# Patient Record
Sex: Female | Born: 1975 | Race: Asian | Hispanic: No | Marital: Married | Smoking: Never smoker
Health system: Southern US, Community
[De-identification: ages and names within clinical notes are randomized; demographics above are authoritative.]

## PROBLEM LIST (undated history)

## (undated) DIAGNOSIS — D219 Benign neoplasm of connective and other soft tissue, unspecified: Secondary | ICD-10-CM

## (undated) HISTORY — DX: Benign neoplasm of connective and other soft tissue, unspecified: D21.9

## (undated) HISTORY — PX: MYOMECTOMY ABDOMINAL APPROACH: SUR870

---

## 2002-06-13 HISTORY — PX: MYOMECTOMY: SHX85

## 2012-11-23 ENCOUNTER — Ambulatory Visit: Payer: Self-pay | Admitting: Family Medicine

## 2015-10-26 DIAGNOSIS — J018 Other acute sinusitis: Secondary | ICD-10-CM | POA: Diagnosis not present

## 2015-10-26 DIAGNOSIS — E6609 Other obesity due to excess calories: Secondary | ICD-10-CM | POA: Diagnosis not present

## 2015-10-26 DIAGNOSIS — Z6834 Body mass index (BMI) 34.0-34.9, adult: Secondary | ICD-10-CM | POA: Diagnosis not present

## 2017-01-16 DIAGNOSIS — Z6833 Body mass index (BMI) 33.0-33.9, adult: Secondary | ICD-10-CM | POA: Diagnosis not present

## 2017-01-16 DIAGNOSIS — M79674 Pain in right toe(s): Secondary | ICD-10-CM | POA: Diagnosis not present

## 2017-01-16 DIAGNOSIS — S96911A Strain of unspecified muscle and tendon at ankle and foot level, right foot, initial encounter: Secondary | ICD-10-CM | POA: Diagnosis not present

## 2017-04-17 ENCOUNTER — Encounter: Payer: Self-pay | Admitting: Family Medicine

## 2017-04-17 ENCOUNTER — Ambulatory Visit: Payer: BLUE CROSS/BLUE SHIELD | Admitting: Family Medicine

## 2017-04-17 VITALS — BP 100/80 | HR 70 | Temp 98.8°F | Ht 62.5 in | Wt 196.0 lb

## 2017-04-17 DIAGNOSIS — R05 Cough: Secondary | ICD-10-CM

## 2017-04-17 DIAGNOSIS — J029 Acute pharyngitis, unspecified: Secondary | ICD-10-CM | POA: Diagnosis not present

## 2017-04-17 MED ORDER — GUAIFENESIN-DM 100-10 MG/5ML PO SYRP: 5.0000 mL | ORAL_SOLUTION | ORAL | 0 refills | Status: DC | PRN

## 2017-04-17 MED ORDER — METHYLPREDNISOLONE ACETATE 80 MG/ML IJ SUSP
80.0000 mg | Freq: Once | INTRAMUSCULAR | Status: AC
  Administered 2017-04-17: 80 mg via INTRAMUSCULAR

## 2017-04-17 NOTE — Progress Notes (Signed)
   Subjective:  Dorothy Flores is a 41 y.o. female who presents today with a chief complaint of sore throat and to establish care.   HPI:  Sore throat, Acute Issue Symptoms started 3-4 days ago. Worsening over that time.  Associated symptoms include hoarse voice, cough, rhinorrhea.  Tried Advil which helped a little bit.  No other treatments tried.  No sick contacts.  No fevers.  No ear pain.  No facial pain.  No obvious aggravating or alleviating factors.  ROS: Per HPI, otherwise a 14 point review of systems was performed and was negative  PMH:  The following were reviewed and entered/updated in epic: History reviewed. No pertinent past medical history. There are no active problems to display for this patient.  Past Surgical History:  Procedure Laterality Date  . CESAREAN SECTION  2005    Family History  Problem Relation Age of Onset  . Heart attack Mother   . Cancer Father        Bone Cancer    Medications- reviewed and updated Current Outpatient Medications  Medication Sig Dispense Refill  . ibuprofen (ADVIL,MOTRIN) 800 MG tablet Take 800 mg by mouth.    Marland Kitchen guaiFENesin-dextromethorphan (ROBITUSSIN DM) 100-10 MG/5ML syrup Take 5 mLs every 4 (four) hours as needed by mouth for cough. 118 mL 0   No current facility-administered medications for this visit.     Allergies-reviewed and updated No Known Allergies  Social History   Socioeconomic History  . Marital status: Married    Spouse name: None  . Number of children: 1  . Years of education: None  . Highest education level: None  Social Needs  . Financial resource strain: None  . Food insecurity - worry: None  . Food insecurity - inability: None  . Transportation needs - medical: None  . Transportation needs - non-medical: None  Occupational History  . Occupation: Self Employed  Tobacco Use  . Smoking status: Never Smoker  . Smokeless tobacco: Never Used  Substance and Sexual Activity  . Alcohol use: No      Frequency: Never  . Drug use: No  . Sexual activity: Yes  Other Topics Concern  . None  Social History Narrative  . None   Objective:  Physical Exam: BP 100/80   Pulse 70   Temp 98.8 F (37.1 C)   Ht 5' 2.5" (1.588 m)   Wt 196 lb (88.9 kg)   LMP 03/30/2017 (Exact Date)   SpO2 99%   BMI 35.28 kg/m   Gen: NAD, resting comfortably HEENT: TMs clear bilaterally.  Oropharynx erythematous without exudate.  Nasal mucosa boggy and erythematous bilaterally.  No lymphadenopathy. CV: RRR with no murmurs appreciated Pulm: NWOB, CTAB with no crackles, wheezes, or rhonchi GI: Normal bowel sounds present. Soft, Nontender, Nondistended. MSK: No edema, cyanosis, or clubbing noted Skin: Warm, dry Neuro: Grossly normal, moves all extremities Psych: Normal affect and thought content  Assessment/Plan:  Sore throat Likely viral URI.  No signs or symptoms of bacterial infection.  Patient wishes to have rapid recovery.  Declined Atrovent nasal spray today.  Opted to have glucocorticoid injection for her sore throat.  80mg  IM depomedrol given. Also sending a prescription for guaifenesin-dextromethorphan cough syrup.  Advised her to continue tylenol/motrin as needed for pain. Return precautions reviewed.  Follow-up as needed.  Preventive healthcare Flu shot deferred.  Follow-up soon for her CPE.  Algis Greenhouse. Jerline Pain, MD 04/17/2017 12:03 PM

## 2017-04-17 NOTE — Patient Instructions (Signed)
Upper Respiratory Infection, Adult Most upper respiratory infections (URIs) are caused by a virus. A URI affects the nose, throat, and upper air passages. The most common type of URI is often called "the common cold." Follow these instructions at home:  Take medicines only as told by your doctor.  Gargle warm saltwater or take cough drops to comfort your throat as told by your doctor.  Use a warm mist humidifier or inhale steam from a shower to increase air moisture. This may make it easier to breathe.  Drink enough fluid to keep your pee (urine) clear or pale yellow.  Eat soups and other clear broths.  Have a healthy diet.  Rest as needed.  Go back to work when your fever is gone or your doctor says it is okay. ? You may need to stay home longer to avoid giving your URI to others. ? You can also wear a face mask and wash your hands often to prevent spread of the virus.  Use your inhaler more if you have asthma.  Do not use any tobacco products, including cigarettes, chewing tobacco, or electronic cigarettes. If you need help quitting, ask your doctor. Contact a doctor if:  You are getting worse, not better.  Your symptoms are not helped by medicine.  You have chills.  You are getting more short of breath.  You have brown or red mucus.  You have yellow or brown discharge from your nose.  You have pain in your face, especially when you bend forward.  You have a fever.  You have puffy (swollen) neck glands.  You have pain while swallowing.  You have white areas in the back of your throat. Get help right away if:  You have very bad or constant: ? Headache. ? Ear pain. ? Pain in your forehead, behind your eyes, and over your cheekbones (sinus pain). ? Chest pain.  You have long-lasting (chronic) lung disease and any of the following: ? Wheezing. ? Long-lasting cough. ? Coughing up blood. ? A change in your usual mucus.  You have a stiff neck.  You have  changes in your: ? Vision. ? Hearing. ? Thinking. ? Mood. This information is not intended to replace advice given to you by your health care provider. Make sure you discuss any questions you have with your health care provider. Document Released: 11/16/2007 Document Revised: 01/31/2016 Document Reviewed: 09/04/2013 Elsevier Interactive Patient Education  2018 [employer]  

## 2017-04-20 ENCOUNTER — Telehealth: Payer: Self-pay | Admitting: Family Medicine

## 2017-04-20 NOTE — Telephone Encounter (Signed)
We can call in atrovent nasal spray if she is willing to try that - this will help with the sore throat and hoarse voice.

## 2017-04-20 NOTE — Telephone Encounter (Signed)
Patient calling to state she is not feeling any better since office visit on 11/5. Asking if there is anything provider can call in as she has now lost her voice almost completely. Please advise.

## 2017-04-21 DIAGNOSIS — J040 Acute laryngitis: Secondary | ICD-10-CM | POA: Diagnosis not present

## 2017-04-21 DIAGNOSIS — J209 Acute bronchitis, unspecified: Secondary | ICD-10-CM | POA: Diagnosis not present

## 2017-04-21 DIAGNOSIS — J329 Chronic sinusitis, unspecified: Secondary | ICD-10-CM | POA: Diagnosis not present

## 2017-04-24 NOTE — Telephone Encounter (Signed)
Pt is aware of annotations and went to the ER is now on antibiotics.

## 2017-05-08 ENCOUNTER — Ambulatory Visit (INDEPENDENT_AMBULATORY_CARE_PROVIDER_SITE_OTHER): Payer: BLUE CROSS/BLUE SHIELD | Admitting: Family Medicine

## 2017-05-08 ENCOUNTER — Encounter: Payer: Self-pay | Admitting: Family Medicine

## 2017-05-08 VITALS — BP 119/75 | HR 65 | Temp 97.6°F | Ht 62.5 in | Wt 194.8 lb

## 2017-05-08 DIAGNOSIS — Z13220 Encounter for screening for lipoid disorders: Secondary | ICD-10-CM | POA: Diagnosis not present

## 2017-05-08 DIAGNOSIS — E669 Obesity, unspecified: Secondary | ICD-10-CM | POA: Diagnosis not present

## 2017-05-08 DIAGNOSIS — Z0001 Encounter for general adult medical examination with abnormal findings: Secondary | ICD-10-CM

## 2017-05-08 LAB — COMPREHENSIVE METABOLIC PANEL
ALK PHOS: 84 U/L (ref 39–117)
ALT: 20 U/L (ref 0–35)
AST: 17 U/L (ref 0–37)
Albumin: 3.7 g/dL (ref 3.5–5.2)
BILIRUBIN TOTAL: 0.6 mg/dL (ref 0.2–1.2)
BUN: 9 mg/dL (ref 6–23)
CALCIUM: 9.1 mg/dL (ref 8.4–10.5)
CO2: 26 mEq/L (ref 19–32)
CREATININE: 0.57 mg/dL (ref 0.40–1.20)
Chloride: 107 mEq/L (ref 96–112)
GFR: 123.05 mL/min (ref >60.00)
Glucose, Bld: 99 mg/dL (ref 70–99)
Potassium: 4.1 mEq/L (ref 3.5–5.1)
Sodium: 140 mEq/L (ref 135–145)
TOTAL PROTEIN: 7.2 g/dL (ref 6.0–8.3)

## 2017-05-08 LAB — LIPID PANEL
Cholesterol: 184 mg/dL (ref 0–200)
HDL: 52.2 mg/dL (ref >39.00)
LDL Cholesterol: 122 mg/dL — ABNORMAL HIGH (ref 0–99)
NONHDL: 131.99
Total CHOL/HDL Ratio: 4
Triglycerides: 49 mg/dL (ref 0.0–149.0)
VLDL: 9.8 mg/dL (ref 0.0–40.0)

## 2017-05-08 LAB — CBC
HCT: 40.6 % (ref 36.0–46.0)
HEMOGLOBIN: 13.1 g/dL (ref 12.0–15.0)
MCHC: 32.2 g/dL (ref 30.0–36.0)
MCV: 83.1 fl (ref 78.0–100.0)
PLATELETS: 316 10*3/uL (ref 150.0–400.0)
RBC: 4.88 Mil/uL (ref 3.87–5.11)
RDW: 14.2 % (ref 11.5–15.5)
WBC: 8.7 10*3/uL (ref 4.0–10.5)

## 2017-05-08 NOTE — Progress Notes (Signed)
Cholesterol is a little high. Her other labs are all normal. No need to start medication - continue working on diet and exercise. We can recheck again next year.  Dorothy Flores. Jerline Pain, MD 05/08/2017 1:20 PM

## 2017-05-08 NOTE — Assessment & Plan Note (Signed)
Counseled on lifestyle modifications including healthy diet and regular exercise.

## 2017-05-08 NOTE — Progress Notes (Signed)
Subjective:  Dorothy Flores is a 41 y.o. female who presents today for her annual comprehensive physical exam.    HPI:  Teela Narducci has no acute complaints today.   Lifestyle Diet: Cutting back on fried foods and sweets. Eats plenty of lean meats, fruits, and vegetables. No sugar sweetened beverages.  Exercise: Regularly exercises twice per week.   Depression screen PHQ 2/9 04/17/2017  Decreased Interest 0  Down, Depressed, Hopeless 0  PHQ - 2 Score 0   Health Maintenance Due  Topic Date Due  . HIV Screening  05/17/1989  . PAP SMEAR  05/18/1995     ROS: A14 point review of systems was performed and was negative  PMH:  The following were reviewed and entered/updated in epic: History reviewed. No pertinent past medical history. Patient Active Problem List   Diagnosis Date Noted  . Obesity (BMI 35.0-39.9 without comorbidity) 05/08/2017   Past Surgical History:  Procedure Laterality Date  . CESAREAN SECTION  2005    Family History  Problem Relation Age of Onset  . Heart attack Mother   . Cancer Father        Bone Cancer   Medications- reviewed and updated No current outpatient medications on file.   No current facility-administered medications for this visit.     Allergies-reviewed and updated No Known Allergies  Social History   Socioeconomic History  . Marital status: Married    Spouse name: None  . Number of children: 1  . Years of education: None  . Highest education level: None  Social Needs  . Financial resource strain: None  . Food insecurity - worry: None  . Food insecurity - inability: None  . Transportation needs - medical: None  . Transportation needs - non-medical: None  Occupational History  . Occupation: Self Employed  Tobacco Use  . Smoking status: Never Smoker  . Smokeless tobacco: Never Used  Substance and Sexual Activity  . Alcohol use: No    Frequency: Never  . Drug use: No  . Sexual activity: Yes  Other  Topics Concern  . None  Social History Narrative  . None    Objective:  Physical Exam: BP 119/75   Pulse 65   Temp 97.6 F (36.4 C) (Oral)   Ht 5' 2.5" (1.588 m)   Wt 194 lb 12.8 oz (88.4 kg)   LMP 03/30/2017 (Exact Date)   SpO2 98%   BMI 35.06 kg/m   Body mass index is 35.06 kg/m. Gen: NAD, resting comfortably HEENT: TMs normal bilaterally. OP clear. No thyromegaly noted.  CV: RRR with no murmurs appreciated Pulm: NWOB, CTAB with no crackles, wheezes, or rhonchi GI: Normal bowel sounds present. Soft, Nontender, Nondistended. MSK: no edema, cyanosis, or clubbing noted Skin: warm, dry Neuro: CN2-12 grossly intact. Strength 5/5 in upper and lower extremities. Reflexes symmetric and intact bilaterally.  Psych: Normal affect and thought content  Assessment/Plan:  Preventative Healthcare: Recommended pap testing, however patient deferred. Gave handout with more information - patient stated she would call us if she wants to schedule this.  Check lipid panel, CBC, CMET.  HIV deferred.  Patient Counseling:  -Nutrition: Stressed importance of moderation in sodium/caffeine intake, saturated fat and cholesterol, caloric balance, sufficient intake of fresh fruits, vegetables, and fiber.  -Stressed the importance of regular exercise.   -Substance Abuse: Discussed cessation/primary prevention of tobacco, alcohol, or other drug use; driving or other dangerous activities under the influence; availability of treatment for abuse.   -Injury prevention:  Discussed safety belts, safety helmets, smoke detector, smoking near bedding or upholstery.   -Sexuality: Discussed sexually transmitted diseases, partner selection, use of condoms, avoidance of unintended pregnancy and contraceptive alternatives.   -Dental health: Discussed importance of regular tooth brushing, flossing, and dental visits.  -Health maintenance and immunizations reviewed. Please refer to Health maintenance section.  Return to  care in 1 year for next preventative visit.   Algis Greenhouse. Jerline Pain, MD 05/08/2017 9:22 AM

## 2017-05-08 NOTE — Patient Instructions (Signed)
Pap Test Why am I having this test? A pap test is sometimes called a pap smear. It is a screening test that is used to check for signs of cancer of the vagina, cervix, and uterus. The test can also identify the presence of infection or precancerous changes. Your health care provider will likely recommend you have this test done on a regular basis. This test may be done: Every 3 years, starting at age 43. Every 5 years, in combination with testing for the presence of human papillomavirus (HPV). More or less often depending on other medical conditions.  What kind of sample is taken? Using a small cotton swab, plastic spatula, or brush, your health care provider will collect a sample of cells from the surface of your cervix. Your cervix is the opening to your uterus, also called a womb. Secretions from the cervix and vagina may also be collected. How do I prepare for this test? Be aware of where you are in your menstrual cycle. You may be asked to reschedule the test if you are menstruating on the day of the test. You may need to reschedule if you have a known vaginal infection on the day of the test. You may be asked to avoid douching or taking a bath the day before or the day of the test. Some medicines can cause abnormal test results, such as digitalis and tetracycline. Talk with your health care provider before your test if you take one of these medicines. What do the results mean? Abnormal test results may indicate a number of health conditions. These may include: Cancer. Although pap test results cannot be used to diagnose cancer of the cervix, vagina, or uterus, they may suggest the possibility of cancer. Further tests would be required to determine if cancer is present. Sexually transmitted disease. Fungal infection. Parasite infection. Herpes infection. A condition causing or contributing to infertility.  It is your responsibility to obtain your test results. Ask the lab or department  performing the test when and how you will get your results. Contact your health care provider to discuss any questions you have about your results. Talk with your health care provider to discuss your results, treatment options, and if necessary, the need for more tests. Talk with your health care provider if you have any questions about your results. This information is not intended to replace advice given to you by your health care provider. Make sure you discuss any questions you have with your health care provider. Document Released: 08/20/2002 Document Revised: 02/03/2016 Document Reviewed: 10/21/2013 Elsevier Interactive Patient Education  2018 [place] Years, Female Preventive care refers to lifestyle choices and visits with your health care provider that can promote health and wellness. What does preventive care include?  A yearly physical exam. This is also called an annual well check.  Dental exams once or twice a year.  Routine eye exams. Ask your health care provider how often you should have your eyes checked.  Personal lifestyle choices, including: ? Daily care of your teeth and gums. ? Regular physical activity. ? Eating a healthy diet. ? Avoiding tobacco and drug use. ? Limiting alcohol use. ? Practicing safe sex. ? Taking low-dose aspirin daily starting at age 4. ? Taking vitamin and mineral supplements as recommended by your health care provider. What happens during an annual well check? The services and screenings done by your health care provider during your annual well check will depend on your age, overall health, lifestyle  risk factors, and family history of disease. Counseling Your health care provider may ask you questions about your:  Alcohol use.  Tobacco use.  Drug use.  Emotional well-being.  Home and relationship well-being.  Sexual activity.  Eating habits.  Work and work Statistician.  Method of birth  control.  Menstrual cycle.  Pregnancy history.  Screening You may have the following tests or measurements:  Height, weight, and BMI.  Blood pressure.  Lipid and cholesterol levels. These may be checked every 5 years, or more frequently if you are over 22 years old.  Skin check.  Lung cancer screening. You may have this screening every year starting at age 56 if you have a 30-pack-year history of smoking and currently smoke or have quit within the past 15 years.  Fecal occult blood test (FOBT) of the stool. You may have this test every year starting at age 53.  Flexible sigmoidoscopy or colonoscopy. You may have a sigmoidoscopy every 5 years or a colonoscopy every 10 years starting at age 43.  Hepatitis C blood test.  Hepatitis B blood test.  Sexually transmitted disease (STD) testing.  Diabetes screening. This is done by checking your blood sugar (glucose) after you have not eaten for a while (fasting). You may have this done every 1-3 years.  Mammogram. This may be done every 1-2 years. Talk to your health care provider about when you should start having regular mammograms. This may depend on whether you have a family history of breast cancer.  BRCA-related cancer screening. This may be done if you have a family history of breast, ovarian, tubal, or peritoneal cancers.  Pelvic exam and Pap test. This may be done every 3 years starting at age 61. Starting at age 78, this may be done every 5 years if you have a Pap test in combination with an HPV test.  Bone density scan. This is done to screen for osteoporosis. You may have this scan if you are at high risk for osteoporosis.  Discuss your test results, treatment options, and if necessary, the need for more tests with your health care provider. Vaccines Your health care provider may recommend certain vaccines, such as:  Influenza vaccine. This is recommended every year.  Tetanus, diphtheria, and acellular pertussis (Tdap,  Td) vaccine. You may need a Td booster every 10 years.  Varicella vaccine. You may need this if you have not been vaccinated.  Zoster vaccine. You may need this after age 61.  Measles, mumps, and rubella (MMR) vaccine. You may need at least one dose of MMR if you were born in 1957 or later. You may also need a second dose.  Pneumococcal 13-valent conjugate (PCV13) vaccine. You may need this if you have certain conditions and were not previously vaccinated.  Pneumococcal polysaccharide (PPSV23) vaccine. You may need one or two doses if you smoke cigarettes or if you have certain conditions.  Meningococcal vaccine. You may need this if you have certain conditions.  Hepatitis A vaccine. You may need this if you have certain conditions or if you travel or work in places where you may be exposed to hepatitis A.  Hepatitis B vaccine. You may need this if you have certain conditions or if you travel or work in places where you may be exposed to hepatitis B.  Haemophilus influenzae type b (Hib) vaccine. You may need this if you have certain conditions.  Talk to your health care provider about which screenings and vaccines you need and how  often you need them. This information is not intended to replace advice given to you by your health care provider. Make sure you discuss any questions you have with your health care provider. Document Released: 06/26/2015 Document Revised: 02/17/2016 Document Reviewed: 03/31/2015 Elsevier Interactive Patient Education  2017 [employer].

## 2017-07-17 ENCOUNTER — Ambulatory Visit: Payer: BLUE CROSS/BLUE SHIELD | Admitting: Family Medicine

## 2017-07-17 ENCOUNTER — Encounter: Payer: Self-pay | Admitting: Family Medicine

## 2017-07-17 ENCOUNTER — Other Ambulatory Visit (HOSPITAL_COMMUNITY)
Admission: RE | Admit: 2017-07-17 | Discharge: 2017-07-17 | Disposition: A | Discharge status: Home / Self Care | Payer: BLUE CROSS/BLUE SHIELD | Source: Ambulatory Visit | Attending: Family Medicine | Admitting: Family Medicine

## 2017-07-17 VITALS — BP 118/72 | HR 68 | Temp 98.3°F | Ht 62.5 in | Wt 197.2 lb

## 2017-07-17 DIAGNOSIS — Z124 Encounter for screening for malignant neoplasm of cervix: Secondary | ICD-10-CM | POA: Diagnosis not present

## 2017-07-17 DIAGNOSIS — R102 Pelvic and perineal pain: Secondary | ICD-10-CM

## 2017-07-17 DIAGNOSIS — N926 Irregular menstruation, unspecified: Secondary | ICD-10-CM

## 2017-07-17 LAB — POCT URINE PREGNANCY: PREG TEST UR: NEGATIVE

## 2017-07-17 NOTE — Patient Instructions (Signed)
We will check an ultrasound to make sure you do not have fibroids.  Take care, Dr Jerline Pain

## 2017-07-17 NOTE — Progress Notes (Signed)
    Subjective:  Dorothy Flores is a 42 y.o. female who presents today for same-day appointment with a chief complaint of amenorrhea.   HPI:  Amenorrhea, acute issue Patient's last period was 2 months ago.  She has been sexually active but does not think that she is pregnant.  She has had some lower abdominal Flores.  No vaginal discharge.  No nausea or vomiting.  Pelvic Flores, new issue Several year history.  Worsened within the last several months.  Patient has a history of uterine fibroids and she is concerned that they may have come back.  No vaginal discharge.  No nausea or vomiting.  No vaginal bleeding.  No obvious alleviating or aggravating factors.  ROS: Per HPI  PMH: She reports that  has never smoked. she has never used smokeless tobacco. She reports that she does not drink alcohol or use drugs.  Objective:  Physical Exam: BP 118/72 (BP Location: Left Arm, Patient Position: Sitting, Cuff Size: Normal)   Pulse 68   Temp 98.3 F (36.8 C) (Oral)   Ht 5' 2.5" (1.588 m)   Wt 197 lb 3.2 oz (89.4 kg)   LMP 05/30/2017   SpO2 98%   Breastfeeding? Unknown   BMI 35.49 kg/m   Gen: NAD, resting comfortably CV: RRR with no murmurs appreciated Pulm: NWOB, CTAB with no crackles, wheezes, or rhonchi GU: Normal external female genitalia.  Cervix visualized without abnormality.  No uterine or adnexal masses noted on bimanual exam.  Chaperone present for exam.  Results for orders placed or performed in visit on 07/17/17 (from the past 24 hour(s))  POCT urine pregnancy     Status: Normal   Collection Time: 07/17/17  2:27 PM  Result Value Ref Range   Preg Test, Ur Negative Negative     Assessment/Plan:  Amenorrhea Correctly test today negative.  Symptoms likely secondary to perimenopause.  Discussed warning signs and reasons to return to care.  Discussed hormonal management with contraception however patient deferred.  Pelvic Flores We will check pelvic ultrasound to rule out  fibroids.  In the interim, continue Tylenol as needed for Flores.  Discussed warning signs and reasons to return.  Preventative healthcare Pap with HPV co-test performed today  Dorothy Roser M. Jerline Pain, MD 07/17/2017 2:42 PM

## 2017-07-19 LAB — CYTOLOGY - PAP
Adequacy: ABSENT
DIAGNOSIS: NEGATIVE
HPV: NOT DETECTED

## 2017-07-20 NOTE — Progress Notes (Signed)
Please let patient know her pap was normal (she does not have mychart).  We should repeat in 5 years.  We are waiting on results of her ultrasound.  Algis Greenhouse. Jerline Pain, MD 07/20/2017 12:04 PM

## 2017-09-11 ENCOUNTER — Other Ambulatory Visit: Payer: Self-pay

## 2017-09-11 DIAGNOSIS — R102 Pelvic and perineal pain: Secondary | ICD-10-CM

## 2017-09-18 ENCOUNTER — Other Ambulatory Visit: Payer: Self-pay

## 2017-09-18 ENCOUNTER — Ambulatory Visit
Admission: RE | Admit: 2017-09-18 | Discharge: 2017-09-18 | Disposition: A | Discharge status: Home / Self Care | Payer: BLUE CROSS/BLUE SHIELD | Source: Ambulatory Visit | Attending: Family Medicine | Admitting: Family Medicine

## 2017-09-18 DIAGNOSIS — D259 Leiomyoma of uterus, unspecified: Secondary | ICD-10-CM | POA: Diagnosis not present

## 2017-09-18 DIAGNOSIS — D219 Benign neoplasm of connective and other soft tissue, unspecified: Secondary | ICD-10-CM

## 2017-09-18 DIAGNOSIS — R102 Pelvic and perineal pain: Secondary | ICD-10-CM

## 2017-11-14 ENCOUNTER — Ambulatory Visit: Payer: BLUE CROSS/BLUE SHIELD | Admitting: Obstetrics and Gynecology

## 2017-11-14 ENCOUNTER — Encounter: Payer: Self-pay | Admitting: Obstetrics and Gynecology

## 2017-11-14 VITALS — BP 126/79 | HR 71 | Resp 16 | Ht 64.0 in | Wt 191.2 lb

## 2017-11-14 DIAGNOSIS — D259 Leiomyoma of uterus, unspecified: Secondary | ICD-10-CM | POA: Diagnosis not present

## 2017-11-14 DIAGNOSIS — N939 Abnormal uterine and vaginal bleeding, unspecified: Secondary | ICD-10-CM

## 2017-11-14 NOTE — Progress Notes (Signed)
GYNECOLOGY OFFICE VISIT NOTE  History:  42 y.o. G1P0 here today for fibroids and pelvic pain. Has had irregular periods, skipped Dec/Jan, had period in Feb/March/April, was 5 days late in May. Has significant pain and cramping, worse than it used to be but only during periods. Her periods are heavier. Going through 2-3 pads per day in the first couple of days and then it slows. She is concerned about whether she needs to have fibroids removed.  H/o abdominal myomectomy in [place] with subsequent pregnancy and c-section for h/o myomectomy.   Patient is not planning future pregnancies, her husband is HIV positive, states she and her son get tested regularly.  History reviewed. No pertinent past medical history.  Past Surgical History:  Procedure Laterality Date  . CESAREAN SECTION  2005  . MYOMECTOMY ABDOMINAL APPROACH      No current outpatient medications on file.  The following portions of the patient's history were reviewed and updated as appropriate: allergies, current medications, past family history, past medical history, past social history, past surgical history and problem list.   Health Maintenance:  Last pap: 07/22/17 normal Last mammogram: n/a  Review of Systems:  Pertinent items noted in HPI and remainder of comprehensive ROS otherwise negative.   Objective:  Physical Exam BP 126/79 (BP Location: Left Arm, Patient Position: Sitting, Cuff Size: Large)   Pulse 71   Resp 16   Ht 5\' 4"  (1.626 m)   Wt 191 lb 3.2 oz (86.7 kg)   LMP 11/11/2017 (Exact Date)   Breastfeeding? No   BMI 32.82 kg/m  CONSTITUTIONAL: Well-developed, well-nourished female in no acute distress.  HENT:  Normocephalic, atraumatic. External right and left ear normal. Oropharynx is clear and moist EYES: Conjunctivae and EOM are normal. Pupils are equal, round, and reactive to light. No scleral icterus.  NECK: Normal range of motion, supple, no masses SKIN: Skin is warm and dry. No rash noted. Not  diaphoretic. No erythema. No pallor. NEUROLOGIC: Alert and oriented to person, place, and time. Normal reflexes, muscle tone coordination. No cranial nerve deficit noted. PSYCHIATRIC: Normal mood and affect. Normal behavior. Normal judgment and thought content. CARDIOVASCULAR: Normal heart rate noted RESPIRATORY: Effort normal, no problems with respiration noted ABDOMEN: Soft, no distention noted.   PELVIC: normal appearing external female genitalia, normal appearing vaginal mucosa and cervix, uterus slightly enlarged on exam, minimal tenderness, no adnexal masses or tendereness MUSCULOSKELETAL: Normal range of motion. No edema noted.  Labs and Imaging No results found.  Assessment & Plan:   1. Uterine leiomyoma, unspecified location Reviewed that fibroids pose no issue to her unless she is having significant pelvic pain, she asked if it would be possible to remove fibroids only, I reviewed that I would not recommend myomectomy as she only has some cramping during periods and is not planning to have future pregnancies, however if her fibroids became significantly larger or if her symptoms worsen, would recommend hysterectomy - she declines hysterectomy at this time, states her cramping and heaviness during periods do not bother her much  2. Abnormal uterine bleeding (AUB) Bleeding slightly heavier than in past but it does not bother her - reviewed need for EMB if her pattern changes significantly or if bleeding becomes significantly heavier - she verbalizes understanding and will return with any further issues   Routine preventative health maintenance measures emphasized. Please refer to After Visit Summary for other counseling recommendations.   Return in about 1 year (around 11/15/2018), or if symptoms worsen or  fail to improve.   Feliz Beam, M.D. Attending [place], [facility] for [employer], [place]

## 2018-02-16 DIAGNOSIS — B9789 Other viral agents as the cause of diseases classified elsewhere: Secondary | ICD-10-CM | POA: Diagnosis not present

## 2018-02-16 DIAGNOSIS — J069 Acute upper respiratory infection, unspecified: Secondary | ICD-10-CM | POA: Diagnosis not present

## 2018-07-03 DIAGNOSIS — R82998 Other abnormal findings in urine: Secondary | ICD-10-CM | POA: Diagnosis not present

## 2018-07-03 DIAGNOSIS — R300 Dysuria: Secondary | ICD-10-CM | POA: Diagnosis not present

## 2018-07-03 DIAGNOSIS — N760 Acute vaginitis: Secondary | ICD-10-CM | POA: Diagnosis not present

## 2018-07-03 DIAGNOSIS — N898 Other specified noninflammatory disorders of vagina: Secondary | ICD-10-CM | POA: Diagnosis not present

## 2018-11-22 ENCOUNTER — Encounter: Payer: Self-pay | Admitting: Family Medicine

## 2018-11-22 ENCOUNTER — Other Ambulatory Visit: Payer: Self-pay

## 2018-11-22 ENCOUNTER — Ambulatory Visit (INDEPENDENT_AMBULATORY_CARE_PROVIDER_SITE_OTHER): Payer: BC Managed Care – PPO | Admitting: Family Medicine

## 2018-11-22 VITALS — BP 118/74 | HR 65 | Temp 98.6°F | Ht 64.0 in | Wt 196.4 lb

## 2018-11-22 DIAGNOSIS — Z0001 Encounter for general adult medical examination with abnormal findings: Secondary | ICD-10-CM

## 2018-11-22 DIAGNOSIS — E785 Hyperlipidemia, unspecified: Secondary | ICD-10-CM | POA: Diagnosis not present

## 2018-11-22 DIAGNOSIS — Z6833 Body mass index (BMI) 33.0-33.9, adult: Secondary | ICD-10-CM | POA: Diagnosis not present

## 2018-11-22 DIAGNOSIS — M79601 Pain in right arm: Secondary | ICD-10-CM

## 2018-11-22 NOTE — Patient Instructions (Signed)
It was very nice to see you today!  Please try making sure you are getting plenty of fluids and plenty of fruits and vegetables.  Please work on exercises.  We will check blood work today.   Eat at least 3 REAL meals and 1-2 snacks per day.  Aim for no more than 5 hours between eating.  Eat breakfast within one hour of getting up.    Obtain twice as many fruits/vegetables as protein or carbohydrate foods for both lunch and dinner.   Cut down on sweet beverages. This includes juice, soda, and sweet tea.    Exercise at least 150 minutes every week.   Take care, Dr Jerline Pain   Preventive Care 40-64 Years, Female Preventive care refers to lifestyle choices and visits with your health care provider that can promote health and wellness. What does preventive care include?   A yearly physical exam. This is also called an annual well check.  Dental exams once or twice a year.  Routine eye exams. Ask your health care provider how often you should have your eyes checked.  Personal lifestyle choices, including: ? Daily care of your teeth and gums. ? Regular physical activity. ? Eating a healthy diet. ? Avoiding tobacco and drug use. ? Limiting alcohol use. ? Practicing safe sex. ? Taking low-dose aspirin daily starting at age 55. ? Taking vitamin and mineral supplements as recommended by your health care provider. What happens during an annual well check? The services and screenings done by your health care provider during your annual well check will depend on your age, overall health, lifestyle risk factors, and family history of disease. Counseling Your health care provider may ask you questions about your:  Alcohol use.  Tobacco use.  Drug use.  Emotional well-being.  Home and relationship well-being.  Sexual activity.  Eating habits.  Work and work Statistician.  Method of birth control.  Menstrual cycle.  Pregnancy history. Screening You may have the  following tests or measurements:  Height, weight, and BMI.  Blood pressure.  Lipid and cholesterol levels. These may be checked every 5 years, or more frequently if you are over 10 years old.  Skin check.  Lung cancer screening. You may have this screening every year starting at age 20 if you have a 30-pack-year history of smoking and currently smoke or have quit within the past 15 years.  Colorectal cancer screening. All adults should have this screening starting at age 60 and continuing until age 39. Your health care provider may recommend screening at age 77. You will have tests every 1-10 years, depending on your results and the type of screening test. People at increased risk should start screening at an earlier age. Screening tests may include: ? Guaiac-based fecal occult blood testing. ? Fecal immunochemical test (FIT). ? Stool DNA test. ? Virtual colonoscopy. ? Sigmoidoscopy. During this test, a flexible tube with a tiny camera (sigmoidoscope) is used to examine your rectum and lower colon. The sigmoidoscope is inserted through your anus into your rectum and lower colon. ? Colonoscopy. During this test, a long, thin, flexible tube with a tiny camera (colonoscope) is used to examine your entire colon and rectum.  Hepatitis C blood test.  Hepatitis B blood test.  Sexually transmitted disease (STD) testing.  Diabetes screening. This is done by checking your blood sugar (glucose) after you have not eaten for a while (fasting). You may have this done every 1-3 years.  Mammogram. This may be done every 1-2  years. Talk to your health care provider about when you should start having regular mammograms. This may depend on whether you have a family history of breast cancer.  BRCA-related cancer screening. This may be done if you have a family history of breast, ovarian, tubal, or peritoneal cancers.  Pelvic exam and Pap test. This may be done every 3 years starting at age 75. Starting  at age 41, this may be done every 5 years if you have a Pap test in combination with an HPV test.  Bone density scan. This is done to screen for osteoporosis. You may have this scan if you are at high risk for osteoporosis. Discuss your test results, treatment options, and if necessary, the need for more tests with your health care provider. Vaccines Your health care provider may recommend certain vaccines, such as:  Influenza vaccine. This is recommended every year.  Tetanus, diphtheria, and acellular pertussis (Tdap, Td) vaccine. You may need a Td booster every 10 years.  Varicella vaccine. You may need this if you have not been vaccinated.  Zoster vaccine. You may need this after age 65.  Measles, mumps, and rubella (MMR) vaccine. You may need at least one dose of MMR if you were born in 1957 or later. You may also need a second dose.  Pneumococcal 13-valent conjugate (PCV13) vaccine. You may need this if you have certain conditions and were not previously vaccinated.  Pneumococcal polysaccharide (PPSV23) vaccine. You may need one or two doses if you smoke cigarettes or if you have certain conditions.  Meningococcal vaccine. You may need this if you have certain conditions.  Hepatitis A vaccine. You may need this if you have certain conditions or if you travel or work in places where you may be exposed to hepatitis A.  Hepatitis B vaccine. You may need this if you have certain conditions or if you travel or work in places where you may be exposed to hepatitis B.  Haemophilus influenzae type b (Hib) vaccine. You may need this if you have certain conditions. Talk to your health care provider about which screenings and vaccines you need and how often you need them. This information is not intended to replace advice given to you by your health care provider. Make sure you discuss any questions you have with your health care provider. Document Released: 06/26/2015 Document Revised:  07/20/2017 Document Reviewed: 03/31/2015 Elsevier Interactive Patient Education  2019 [employer].

## 2018-11-22 NOTE — Progress Notes (Signed)
Chief Complaint:  Dorothy Flores is a 43 y.o. female who presents today for her annual comprehensive physical exam.    Assessment/Plan:  Dyslipidemia Check lipid panel, CBC, C met, and TSH.  Constipation Encouraged good oral hydration and diet high in fiber.  Shoulder Pain Likely rotator cuff pathology.  She has normal strength testing-do not think she has a tear.  Discussed home exercise program and handout was given.  She can also use over-the-counter analgesics as needed.  BMI 33 Discussed lifestyle modification.  Preventative Healthcare: Up-to-date on Nexium and screenings.  Patient Counseling(The following topics were reviewed and/or handout was given):  -Nutrition: Stressed importance of moderation in sodium/caffeine intake, saturated fat and cholesterol, caloric balance, sufficient intake of fresh fruits, vegetables, and fiber.  -Stressed the importance of regular exercise.   -Substance Abuse: Discussed cessation/primary prevention of tobacco, alcohol, or other drug use; driving or other dangerous activities under the influence; availability of treatment for abuse.   -Injury prevention: Discussed safety belts, safety helmets, smoke detector, smoking near bedding or upholstery.   -Sexuality: Discussed sexually transmitted diseases, partner selection, use of condoms, avoidance of unintended pregnancy and contraceptive alternatives.   -Dental health: Discussed importance of regular tooth brushing, flossing, and dental visits.  -Health maintenance and immunizations reviewed. Please refer to Health maintenance section.  Return to care in 1 year for next preventative visit.     Subjective:  HPI:  She has no acute complaints today.   She has had some right arm pain for the last 2 weeks. No obvious precipitating events. No trauma. Worse with certain movements and with lying on that side.  She also had some issues with constipation.  This seems to be improving with  increased fluid intake.  Lifestyle Diet: No specific diet.  Is trying to cut down rice and other carbs. Exercise: No specific exercise plans.  Depression screen PHQ 2/9 11/22/2018  Decreased Interest 0  Down, Depressed, Hopeless 0  PHQ - 2 Score 0    Health Maintenance Due  Topic Date Due  . HIV Screening  05/17/1989     ROS: Per HPI, otherwise a complete review of systems was negative.   PMH:  The following were reviewed and entered/updated in epic: No past medical history on file. Patient Active Problem List   Diagnosis Date Noted  . Obesity (BMI 35.0-39.9 without comorbidity) 05/08/2017   Past Surgical History:  Procedure Laterality Date  . CESAREAN SECTION  2005  . MYOMECTOMY ABDOMINAL APPROACH      Family History  Problem Relation Age of Onset  . Heart attack Mother   . Cancer Father        Bone Cancer    Medications- reviewed and updated No current outpatient medications on file.   No current facility-administered medications for this visit.     Allergies-reviewed and updated No Known Allergies  Social History   Socioeconomic History  . Marital status: Married    Spouse name: Not on file  . Number of children: 1  . Years of education: Not on file  . Highest education level: Not on file  Occupational History  . Occupation: Self Employed  Social Needs  . Financial resource strain: Not on file  . Food insecurity    Worry: Not on file    Inability: Not on file  . Transportation needs    Medical: Not on file    Non-medical: Not on file  Tobacco Use  . Smoking status: Never Smoker  .  Smokeless tobacco: Never Used  Substance and Sexual Activity  . Alcohol use: No    Frequency: Never  . Drug use: No  . Sexual activity: Yes  Lifestyle  . Physical activity    Days per week: Not on file    Minutes per session: Not on file  . Stress: Not on file  Relationships  . Social Herbalist on phone: Not on file    Gets together: Not on file     Attends religious service: Not on file    Active member of club or organization: Not on file    Attends meetings of clubs or organizations: Not on file    Relationship status: Not on file  Other Topics Concern  . Not on file  Social History Narrative  . Not on file        Objective:  Physical Exam: BP 118/74 (BP Location: Left Arm, Patient Position: Sitting, Cuff Size: Large)   Pulse 65   Temp 98.6 F (37 C) (Oral)   Ht 5' 4"  (1.626 m)   Wt 196 lb 6.1 oz (89.1 kg)   SpO2 99%   BMI 33.71 kg/m   Body mass index is 33.71 kg/m. Wt Readings from Last 3 Encounters:  11/22/18 196 lb 6.1 oz (89.1 kg)  11/14/17 191 lb 3.2 oz (86.7 kg)  07/17/17 197 lb 3.2 oz (89.4 kg)   Gen: NAD, resting comfortably HEENT: TMs normal bilaterally. OP clear. No thyromegaly noted.  CV: RRR with no murmurs appreciated Pulm: NWOB, CTAB with no crackles, wheezes, or rhonchi GI: Normal bowel sounds present. Soft, Nontender, Nondistended. MSK: no edema, cyanosis, or clubbing noted.  Right shoulder without abnormalities.  Tender to palpation along right lateral edge.  Neer's test positive.  Strength 5 out of 5 throughout. Skin: warm, dry Neuro: CN2-12 grossly intact. Strength 5/5 in upper and lower extremities. Reflexes symmetric and intact bilaterally.  Psych: Normal affect and thought content     Caleb M. Jerline Pain, MD 11/22/2018 3:06 PM

## 2018-11-23 LAB — LIPID PANEL
Cholesterol: 166 mg/dL (ref 0–200)
HDL: 50.5 mg/dL (ref >39.00)
LDL Cholesterol: 99 mg/dL (ref 0–99)
NonHDL: 115.63
Total CHOL/HDL Ratio: 3
Triglycerides: 82 mg/dL (ref 0.0–149.0)
VLDL: 16.4 mg/dL (ref 0.0–40.0)

## 2018-11-23 LAB — CBC
HCT: 36.2 % (ref 36.0–46.0)
Hemoglobin: 11.8 g/dL — ABNORMAL LOW (ref 12.0–15.0)
MCHC: 32.6 g/dL (ref 30.0–36.0)
MCV: 80.7 fl (ref 78.0–100.0)
Platelets: 350 10*3/uL (ref 150.0–400.0)
RBC: 4.48 Mil/uL (ref 3.87–5.11)
RDW: 14.7 % (ref 11.5–15.5)
WBC: 8.9 10*3/uL (ref 4.0–10.5)

## 2018-11-23 LAB — COMPREHENSIVE METABOLIC PANEL
ALT: 32 U/L (ref 0–35)
AST: 29 U/L (ref 0–37)
Albumin: 3.9 g/dL (ref 3.5–5.2)
Alkaline Phosphatase: 95 U/L (ref 39–117)
BUN: 7 mg/dL (ref 6–23)
CO2: 26 mEq/L (ref 19–32)
Calcium: 8.8 mg/dL (ref 8.4–10.5)
Chloride: 105 mEq/L (ref 96–112)
Creatinine, Ser: 0.56 mg/dL (ref 0.40–1.20)
GFR: 117.32 mL/min (ref >60.00)
Glucose, Bld: 81 mg/dL (ref 70–99)
Potassium: 3.7 mEq/L (ref 3.5–5.1)
Sodium: 139 mEq/L (ref 135–145)
Total Bilirubin: 0.7 mg/dL (ref 0.2–1.2)
Total Protein: 6.8 g/dL (ref 6.0–8.3)

## 2018-11-23 LAB — TSH: TSH: 3.72 u[IU]/mL (ref 0.35–4.50)

## 2018-11-26 NOTE — Progress Notes (Signed)
Please inform patient:  Good news! Blood work looks great. Would like for her to keep up the great work and we can recheck in a few years.  Dorothy Flores. Jerline Pain, MD 11/26/2018 12:08 PM

## 2018-12-31 IMAGING — US US PELVIS COMPLETE TRANSABD/TRANSVAG
1 series · 14 of 25 positions shown · non-contrast
Comparison: None

CLINICAL DATA: Pelvic pain. Fibroids. Amenorrhea for past 4 months.

EXAM:
TRANSABDOMINAL AND TRANSVAGINAL ULTRASOUND OF PELVIS
TECHNIQUE: Both transabdominal and transvaginal ultrasound examinations of the
pelvis were performed. Transabdominal technique was performed for
global imaging of the pelvis including uterus, ovaries, adnexal
regions, and pelvic cul-de-sac. It was necessary to proceed with
endovaginal exam following the transabdominal exam to visualize the
endometrium and ovaries.

[Series 1: us pelvis complete transabd/transvag · 0.23mm/px · 14 of 64 slices shown]
[im 1/64]
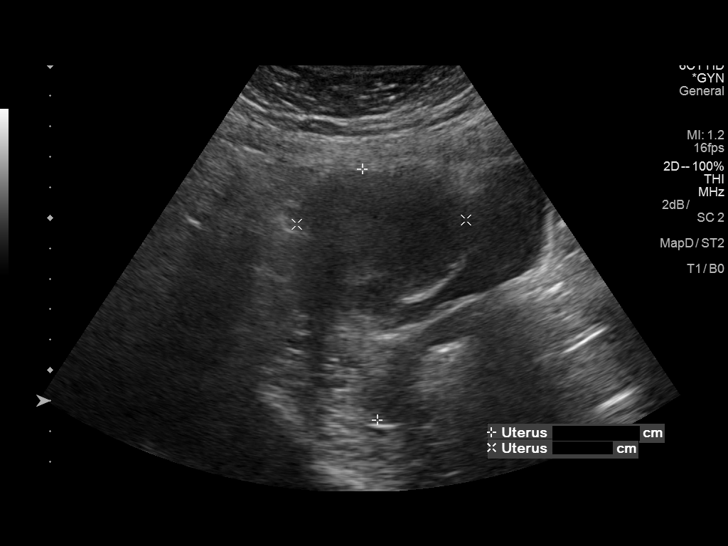
[im 6/64]
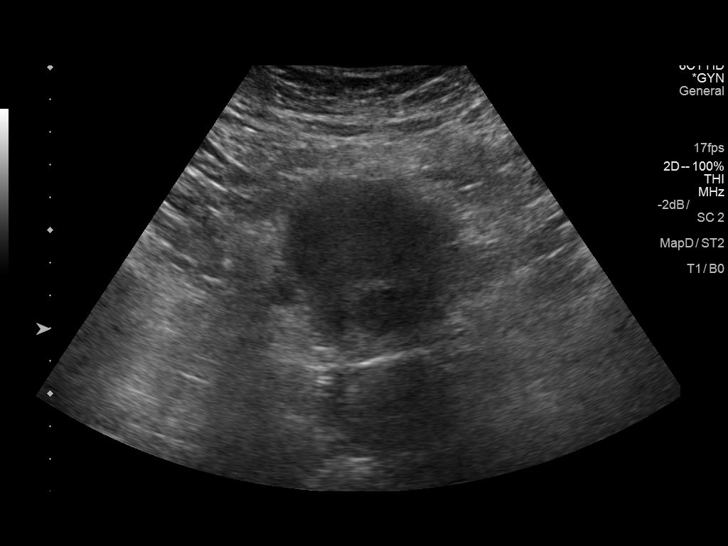
[im 11/64]
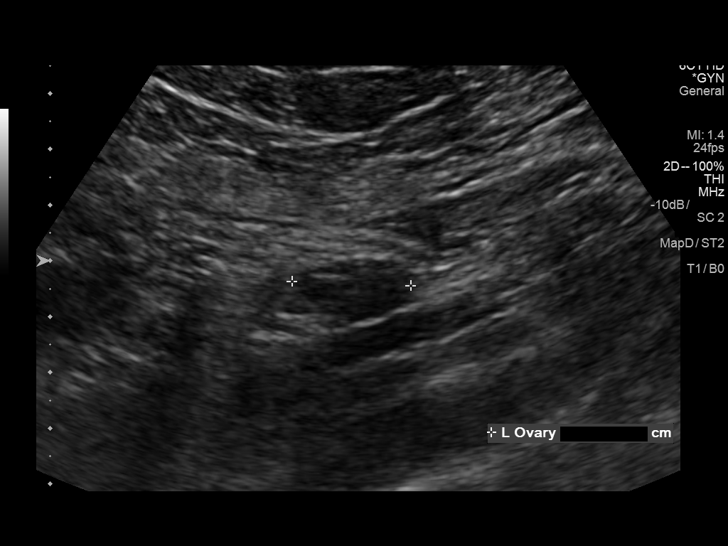
[im 16/64]
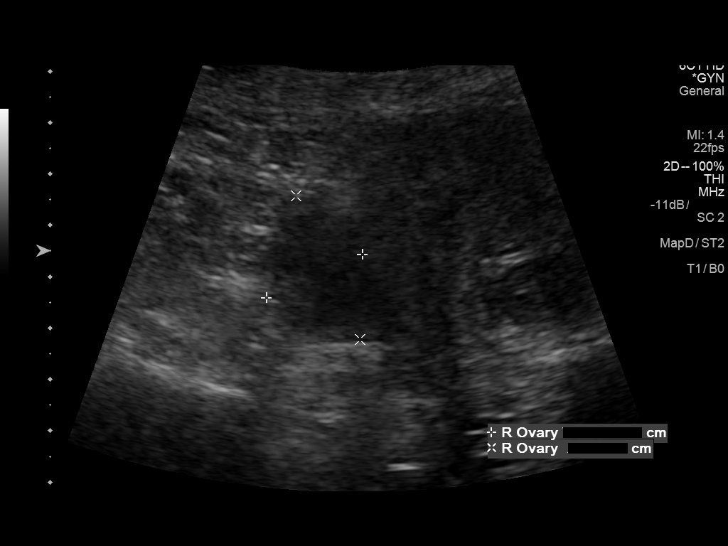
[im 22/64]
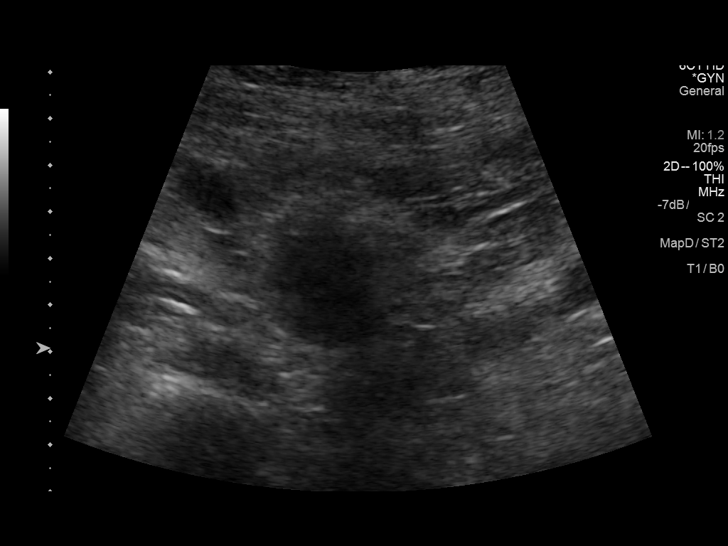
[im 24/64]
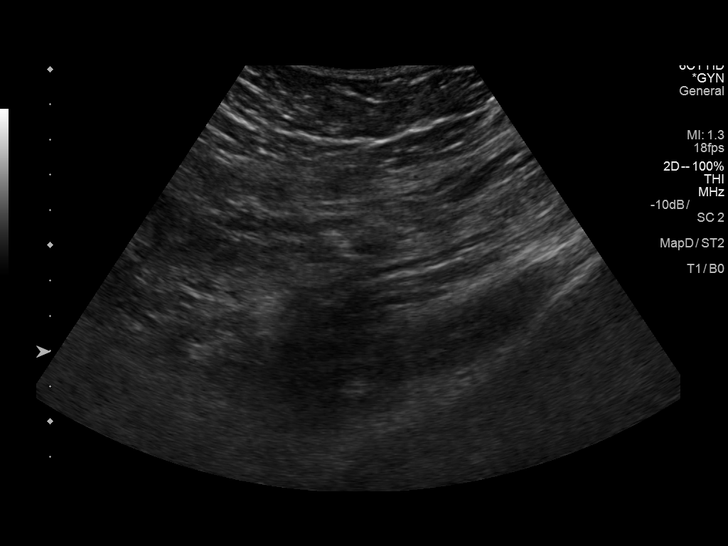
[im 29/64]
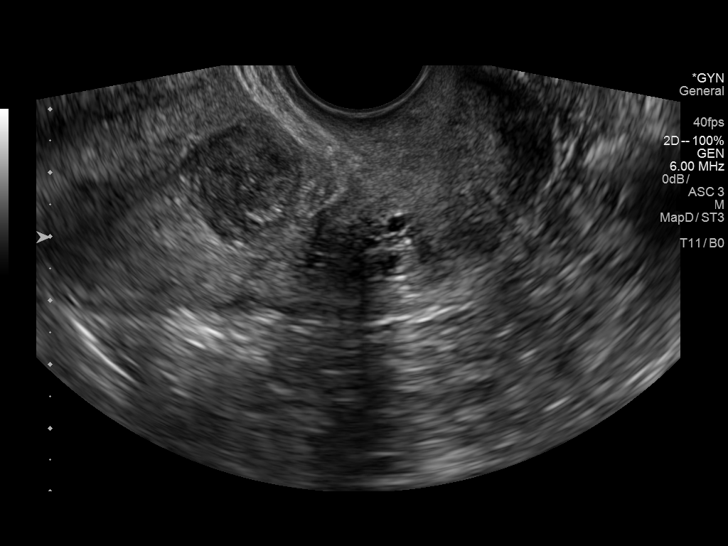
[im 35/64]
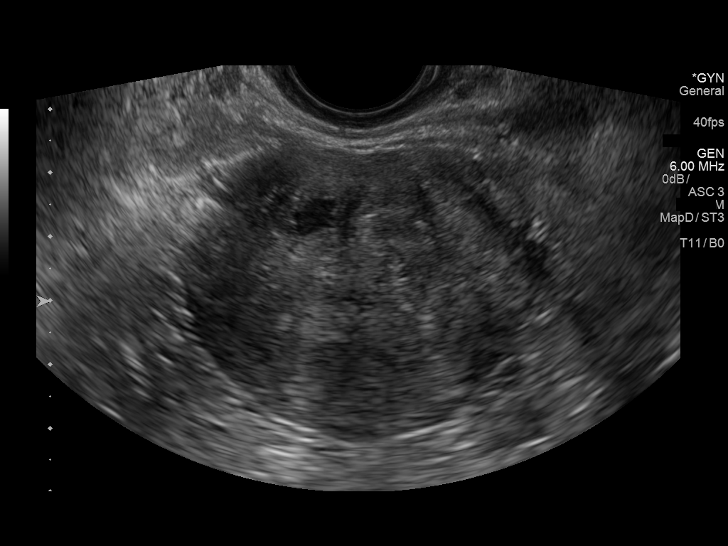
[im 40/64]
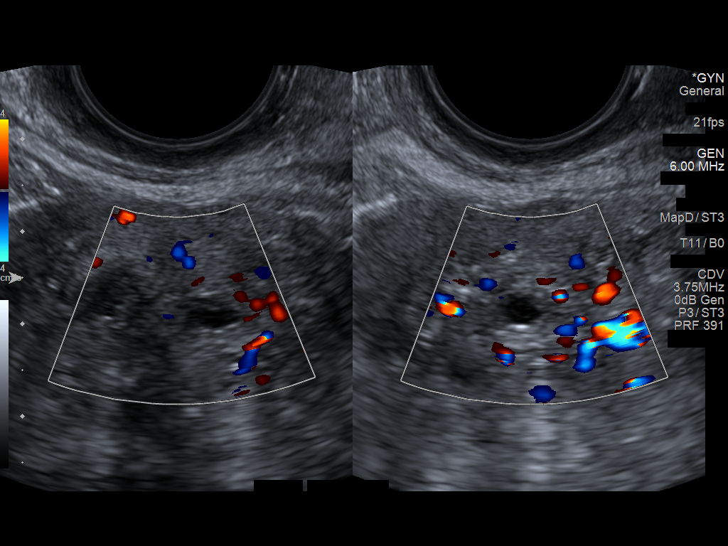
[im 43/64]
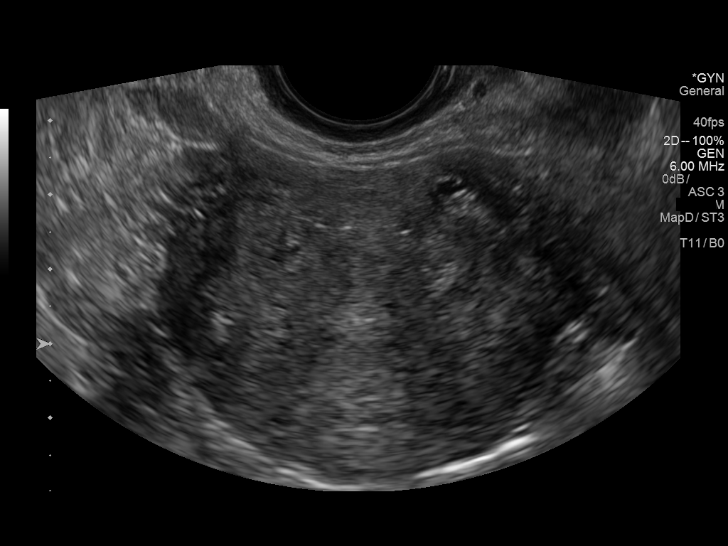
[im 48/64]
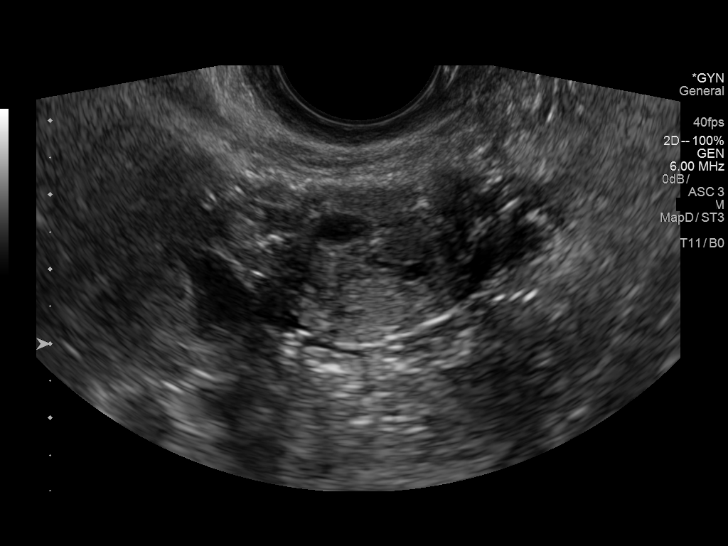
[im 53/64]
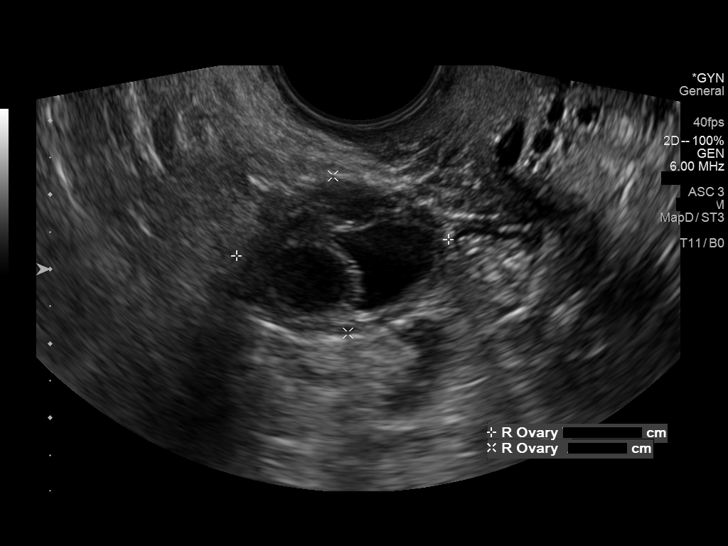
[im 58/64]
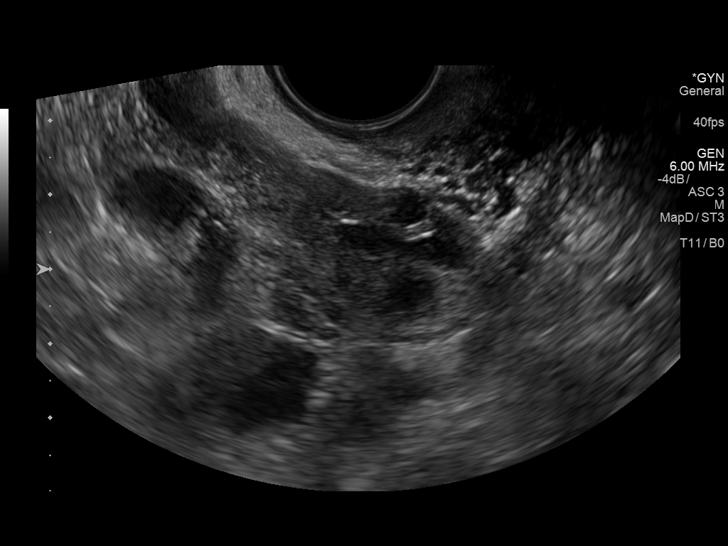
[im 64/64]
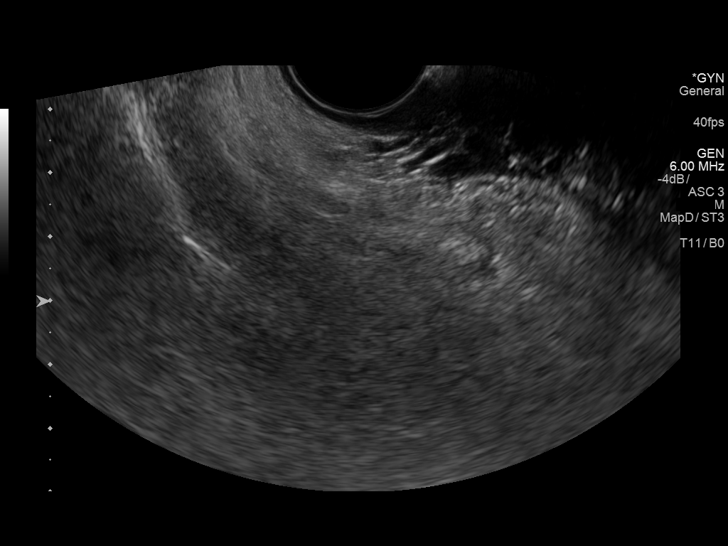

[14 of 25 positions shown; findings below may reference images not displayed]

FINDINGS: Uterus

Measurements: 9.3 x 5.1 x 4.9 cm. Multiple small uterine fibroids
are seen which range in size from less than 1 cm to 2.2 cm in the
anterior corpus.

Endometrium

Thickness: 6 mm. No focal abnormality visualized. No evidence of
hydrometros.

Right ovary

Measurements: 2.8 x 2.1 x 2.9 cm. Normal appearance/no adnexal mass.

Left ovary

Measurements: 2.1 x 1.1 x 2.1 cm. Normal appearance/no adnexal mass.

Other findings

No abnormal free fluid.
IMPRESSION: Multiple small uterine fibroids, largest measuring 2.2 cm.

Normal appearance of both ovaries.  No adnexal mass identified.

## 2019-08-12 ENCOUNTER — Telehealth: Payer: Self-pay | Admitting: Family Medicine

## 2019-08-12 NOTE — Telephone Encounter (Signed)
Patient called in this morning asking if Dr.Parker has put it in a referral to a GYN, as her periods have become even more irregular.

## 2019-08-12 NOTE — Telephone Encounter (Signed)
Spoke with patient was given number to her previous obgyn

## 2019-09-09 ENCOUNTER — Other Ambulatory Visit: Payer: Self-pay

## 2019-09-09 ENCOUNTER — Encounter: Payer: Self-pay | Admitting: Advanced Practice Midwife

## 2019-09-09 ENCOUNTER — Ambulatory Visit (INDEPENDENT_AMBULATORY_CARE_PROVIDER_SITE_OTHER): Payer: BC Managed Care – PPO | Admitting: Advanced Practice Midwife

## 2019-09-09 VITALS — BP 114/79 | HR 62 | Ht 64.0 in | Wt 198.0 lb

## 2019-09-09 DIAGNOSIS — Z1151 Encounter for screening for human papillomavirus (HPV): Secondary | ICD-10-CM | POA: Diagnosis not present

## 2019-09-09 DIAGNOSIS — R102 Pelvic and perineal pain: Secondary | ICD-10-CM

## 2019-09-09 DIAGNOSIS — Z124 Encounter for screening for malignant neoplasm of cervix: Secondary | ICD-10-CM

## 2019-09-09 DIAGNOSIS — Z01419 Encounter for gynecological examination (general) (routine) without abnormal findings: Secondary | ICD-10-CM | POA: Diagnosis not present

## 2019-09-09 NOTE — Progress Notes (Signed)
Subjective:     Dorothy Flores is a 44 y.o. female here at [facility] for a routine exam.    Current complaints: left sided pelvic pain, "feels like something is moving" in pelvis that started 2 weeks ago and occurs once a week. Patient denies vaginal bleeding and discharge. Patient states her last normal menstrual period was January 2021, patient states she did not have a menstrual cycle in February, and now she is having spotting in March with her cycle starting September 06, 2019.Patient also complains of feeling gassy with every food and meal and states that it is painful, states she has not taken anything for gas, states she has normal bowel movements with occassional constipation.States oats and increased water intake helps with the constipation. Also states she is worried about not losing weight even with exercise and diet change. Personal health questionnaire reviewed: yes.  Patient requests a pap smear today.  Do you have a primary care provider? Dr. Jerline Pain How many times per week do you exercise? 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 Yes Has anyone hit, slapped, or kicked you recently? No Do you feel sad, tired, or upset most days or are you mostly happy with life? Denies sadness and fatigue. States she has a little bit of a temper.   Gynecologic History No LMP recorded. LMP: 09/06/2019 Contraception: condoms Last Pap: 2019. Results were: normal Last mammogram: 2019. Results were: normal  Obstetric History OB History  Gravida Para Term Preterm AB Living  1 1 1     1   SAB TAB Ectopic Multiple Live Births          1    # Outcome Date GA Lbr Len/2nd Weight Sex Delivery Anes PTL Lv  1 Term      [id number]-[id number]   LIV     The following portions of the patient's history were reviewed and updated as appropriate: allergies, current medications, past family history, past medical history, past social history, past surgical history and problem  list.  Review of Systems Pertinent items noted in HPI and remainder of comprehensive ROS otherwise negative.    Objective:    BP 114/79   Pulse 62   Ht 5\' 4"  (1.626 m)   Wt 89.8 kg   BMI 33.99 kg/m   General Appearance:    Alert, cooperative, no distress, appears stated age  Head:    Normocephalic, without obvious abnormality, atraumatic  Eyes:    PERRL, conjunctiva/corneas clear, EOM's intact, fundi    benign, both eyes  Ears:    Normal external ear canals, both ears  Nose:   Nares normal  Throat:   Lips, mucosa, and tongue normal; teeth and gums normal  Neck:   Supple, symmetrical, trachea midline, no adenopathy;    thyroid:  no enlargement/tenderness/nodules; no carotid   bruit or JVD  Back:     Symmetric, no curvature, ROM normal, no CVA tenderness  Lungs:     Clear to auscultation bilaterally, respirations unlabored  Chest Wall:    No tenderness or deformity   Heart:    Regular rate and rhythm, S1 and S2 normal, no murmur, rub   or gallop  Breast Exam:    No tenderness, masses, or nipple abnormality  Abdomen:     Soft, non-tender  Genitalia:    Normal female without lesion, discharge or tenderness     Extremities:   Extremities normal, atraumatic, no cyanosis or edema  Pulses:   2+ and symmetric all extremities  Skin:   Skin color, texture, turgor normal, no rashes or lesions  Lymph nodes:   Cervical, supraclavicular, and axillary nodes normal  Neurologic:   CNII-XII intact          Assessment/Plan:   1. Well woman exam with routine gynecological exam - Cytology - PAP( [facility])  2. Acute pelvic pain, female - Urine Culture - US PELVIC COMPLETE WITH TRANSVAGINAL; Future Most likely digestive pain, pain improves with patient efforts at home. Patient denies wanting medications at this time. History of uterine fibroids.  Follow up in: 1 year for annual exam or as needed.   Irven Shelling, Student-PA 12:01 PM   Attestation of Supervision of Student:  I  confirm that I have verified the information documented in the physician assistant student's note and that I have also personally performed the history, physical exam and all medical decision making activities.  I have verified that all services and findings are accurately documented in this student's note; and I agree with management and plan as outlined in the documentation. I have also made any necessary editorial changes.    Fatima Blank, [place] Certified [place] for [employer], [place] [facility] 09/09/2019 4:54 PM

## 2019-09-09 NOTE — Progress Notes (Signed)
Patient presents for her Annual Exam today.  Last pap:07/17/2017 LMP: 06/18/19  Mammogram: not this year 2019 per pt  Hx of Family  Breast Cancer: NONE  STD Screening: Declines   CC:  last 2 months no cycles, had spotting  Lower pelvic pain , gas all the time after eating.

## 2019-09-10 LAB — CYTOLOGY - PAP
Adequacy: ABSENT
Comment: NEGATIVE
Diagnosis: NEGATIVE
High risk HPV: NEGATIVE

## 2019-09-11 LAB — URINE CULTURE

## 2019-09-17 ENCOUNTER — Other Ambulatory Visit: Payer: Self-pay

## 2019-09-17 ENCOUNTER — Ambulatory Visit (HOSPITAL_COMMUNITY)
Admission: RE | Admit: 2019-09-17 | Discharge: 2019-09-17 | Disposition: A | Discharge status: Home / Self Care | Payer: BC Managed Care – PPO | Source: Ambulatory Visit | Attending: Advanced Practice Midwife | Admitting: Advanced Practice Midwife

## 2019-09-17 DIAGNOSIS — R102 Pelvic and perineal pain: Secondary | ICD-10-CM | POA: Diagnosis not present

## 2019-09-17 DIAGNOSIS — D251 Intramural leiomyoma of uterus: Secondary | ICD-10-CM | POA: Diagnosis not present

## 2020-01-26 DIAGNOSIS — N898 Other specified noninflammatory disorders of vagina: Secondary | ICD-10-CM | POA: Diagnosis not present

## 2020-03-23 ENCOUNTER — Other Ambulatory Visit: Payer: Self-pay

## 2020-03-23 ENCOUNTER — Telehealth: Payer: Self-pay

## 2020-03-23 DIAGNOSIS — N898 Other specified noninflammatory disorders of vagina: Secondary | ICD-10-CM

## 2020-03-23 NOTE — Telephone Encounter (Signed)
Pt called asking if Dr. Jerline Pain would send a referral to gyn for her. Please advise.

## 2020-03-23 NOTE — Telephone Encounter (Signed)
Ok with me. Please place any necessary orders. 

## 2020-03-23 NOTE — Telephone Encounter (Signed)
Referral for GYN placed.

## 2020-03-23 NOTE — Telephone Encounter (Signed)
Please advise 

## 2020-03-26 ENCOUNTER — Other Ambulatory Visit: Payer: Self-pay

## 2020-03-26 ENCOUNTER — Encounter: Payer: Self-pay | Admitting: Obstetrics and Gynecology

## 2020-03-26 ENCOUNTER — Ambulatory Visit (INDEPENDENT_AMBULATORY_CARE_PROVIDER_SITE_OTHER): Payer: BC Managed Care – PPO | Admitting: Obstetrics and Gynecology

## 2020-03-26 VITALS — BP 122/76 | HR 68 | Ht 63.0 in | Wt 196.6 lb

## 2020-03-26 DIAGNOSIS — N762 Acute vulvitis: Secondary | ICD-10-CM

## 2020-03-26 MED ORDER — FLUCONAZOLE 150 MG PO TABS: 150.0000 mg | ORAL_TABLET | Freq: Once | ORAL | 0 refills | Status: AC

## 2020-03-26 MED ORDER — NYSTATIN-TRIAMCINOLONE 100000-0.1 UNIT/GM-% EX CREA: 1.0000 "application " | TOPICAL_CREAM | Freq: Two times a day (BID) | CUTANEOUS | 0 refills | Status: DC

## 2020-03-26 NOTE — Patient Instructions (Signed)

## 2020-03-26 NOTE — Progress Notes (Signed)
GYNECOLOGY  VISIT   HPI: 44 y.o.   Married Asian  female   G1P1001 with Patient's last menstrual period was 11/26/2019 (approximate).   here for irritation in groin and rectal region for 3 - 4 days.  She is feeling dryness.  She is using aloe vera gel and this improves it.   Had vaginal discharge and had negative evaluation 15 days ago.  No current discharge.  No vaginal odor.   No recent antibiotics.  No steroid use.   New laundry detergent.   Hx of myomectomy 2004. Patient has had recent  PUS revealing 2 small fibroids--in epic.  Has a Environmental consultant  In [place].   Had her Covid vaccine.   GYNECOLOGIC HISTORY: Patient's last menstrual period was 11/26/2019 (approximate). Contraception:  Condoms sometimes Menopausal hormone therapy:  n/a Last mammogram: Never Last pap smear:pap 09-09-19 Neg:Neg HR HPV, 07-17-17 Neg:Neg HR HPV        OB History    Gravida  1   Para  1   Term  1   Preterm      AB      Living  1     SAB      TAB      Ectopic      Multiple      Live Births  1              Patient Active Problem List   Diagnosis Date Noted  . Obesity (BMI 35.0-39.9 without comorbidity) 05/08/2017    Past Medical History:  Diagnosis Date  . Fibroid     Past Surgical History:  Procedure Laterality Date  . CESAREAN SECTION  2005  . MYOMECTOMY  2004   done in [place]  . MYOMECTOMY ABDOMINAL APPROACH      No current outpatient medications on file.   No current facility-administered medications for this visit.     ALLERGIES: Patient has no known allergies.  Family History  Problem Relation Age of Onset  . Heart attack Mother   . Cancer Father        Mouth Cancer  . Heart disease Maternal Grandmother   . Heart disease Maternal Grandfather     Social History   Socioeconomic History  . Marital status: Married    Spouse name: Not on file  . Number of children: 1  . Years of education: Not on file  . Highest education level: Not  on file  Occupational History  . Occupation: Self Employed  Tobacco Use  . Smoking status: Never Smoker  . Smokeless tobacco: Never Used  Vaping Use  . Vaping Use: Never used  Substance and Sexual Activity  . Alcohol use: No  . Drug use: No  . Sexual activity: Yes    Birth control/protection: Condom    Comment: condoms sometimes  Other Topics Concern  . Not on file  Social History Narrative  . Not on file   Social Determinants of Health   Financial Resource Strain:   . Difficulty of Paying Living Expenses: Not on file  Food Insecurity:   . Worried About Charity fundraiser in the Last Year: Not on file  . Ran Out of Food in the Last Year: Not on file  Transportation Needs:   . Lack of Transportation (Medical): Not on file  . Lack of Transportation (Non-Medical): Not on file  Physical Activity:   . Days of Exercise per Week: Not on file  . Minutes of Exercise per Session: Not  on file  Stress:   . Feeling of Stress : Not on file  Social Connections:   . Frequency of Communication with Friends and Family: Not on file  . Frequency of Social Gatherings with Friends and Family: Not on file  . Attends Religious Services: Not on file  . Active Member of Clubs or Organizations: Not on file  . Attends Archivist Meetings: Not on file  . Marital Status: Not on file  Intimate Partner Violence:   . Fear of Current or Ex-Partner: Not on file  . Emotionally Abused: Not on file  . Physically Abused: Not on file  . Sexually Abused: Not on file    Review of Systems  All other systems reviewed and are negative.   PHYSICAL EXAMINATION:    BP 122/76   Pulse 68   Ht 5\' 3"  (1.6 m)   Wt 196 lb 9.6 oz (89.2 kg)   LMP 11/26/2019 (Approximate)   SpO2 100%   BMI 34.83 kg/m     General appearance: alert, cooperative and appears stated age Head: Normocephalic, without obvious abnormality, atraumatic Lungs: clear to auscultation bilaterally Heart: regular rate and  rhythm Abdomen: soft, non-tender, no masses,  no organomegaly Extremities: extremities normal, atraumatic, no cyanosis or edema No abnormal inguinal nodes palpated Neurologic: Grossly normal  Pelvic: External genitalia:  Thickened grey-pink skin of the bilateral groin regions and and superior vulvar region.               Urethra:  normal appearing urethra with no masses, tenderness or lesions              Bartholins and Skenes: normal                 Vagina: normal appearing vagina with normal color and discharge, no lesions              Cervix: no lesions                Bimanual Exam:  Uterus:  normal size, contour, position, consistency, mobility, non-tender              Adnexa: no mass, fullness, tenderness                Chaperone was present for exam.  ASSESSMENT  Vulvitis.  Looks like candida infection.  PLAN  We discussed yeast vulvitis - risk factors.  Affirm sent.  Diflucan course.  Mycolog II.  We reviewed potential skin irritants and wearing loose clothing.  Call if symptoms are not improved.  Mammogram recommended as screening for early breast cancer.

## 2020-03-27 LAB — VAGINITIS/VAGINOSIS, DNA PROBE
Candida Species: NEGATIVE
Gardnerella vaginalis: NEGATIVE
Trichomonas vaginosis: NEGATIVE

## 2020-05-17 IMAGING — US US PELVIS COMPLETE WITH TRANSVAGINAL
1 series · 15 of 25 positions shown · non-contrast
Comparison: None

CLINICAL DATA: Pelvic pain in a female, acute RIGHT lower quadrant
pain



[Series 1: us pelvis complete with transvaginal · 15 of 55 slices shown]
[im 1/55]
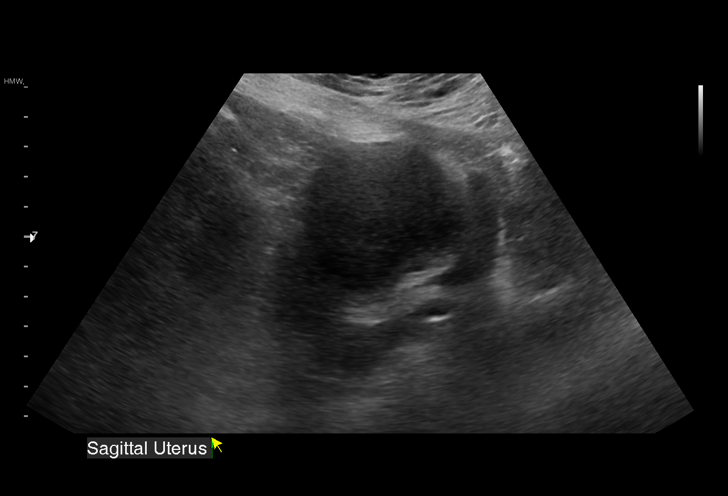
[im 5/55]
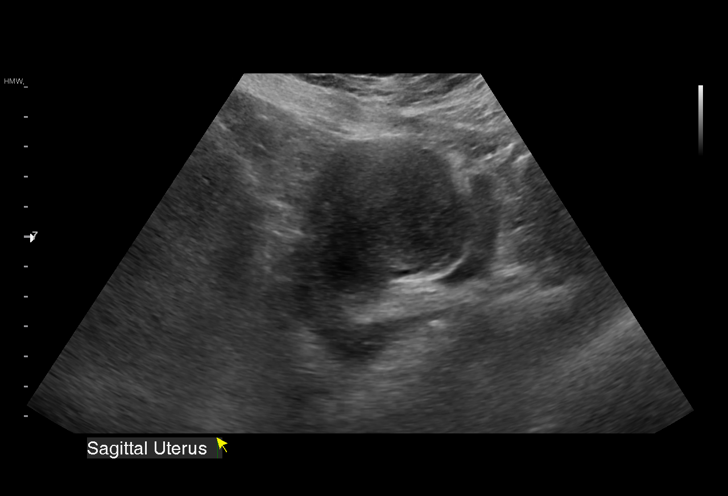
[im 10/55]
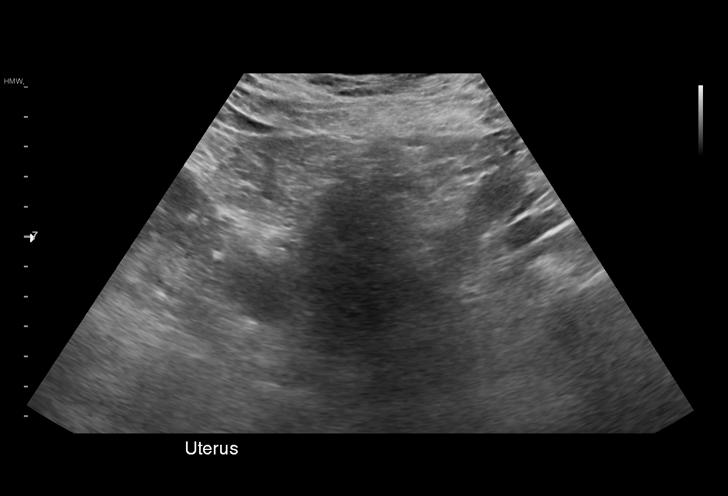
[im 12/55]
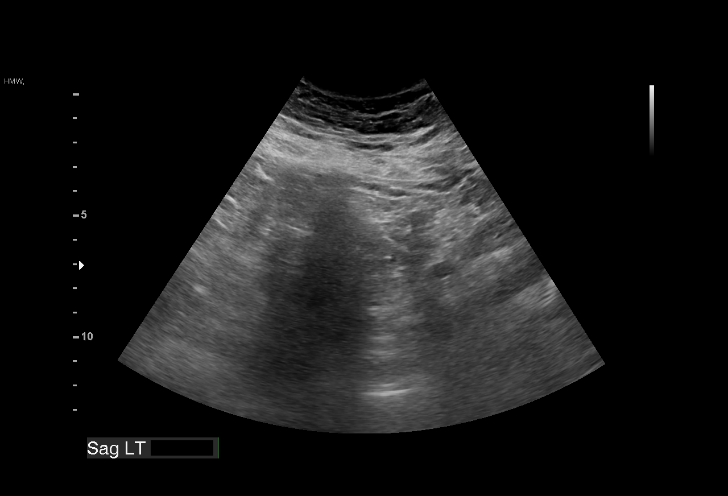
[im 16/55]
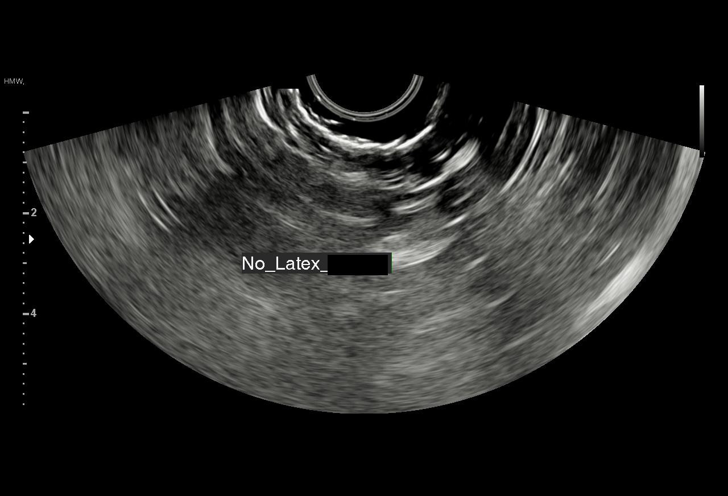
[im 21/55]
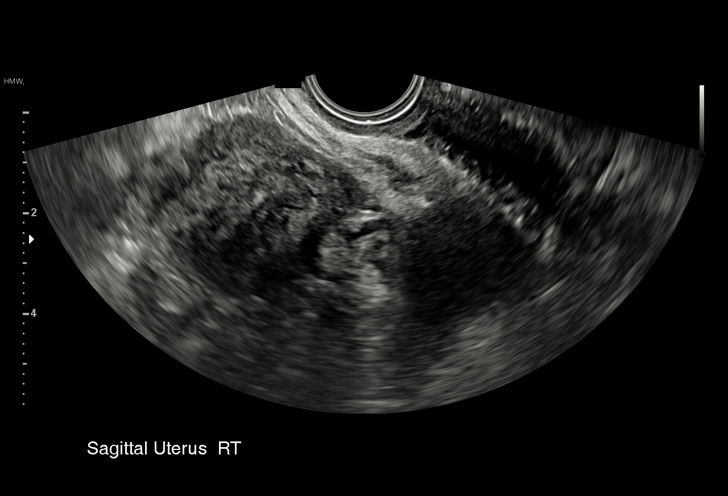
[im 23/55]
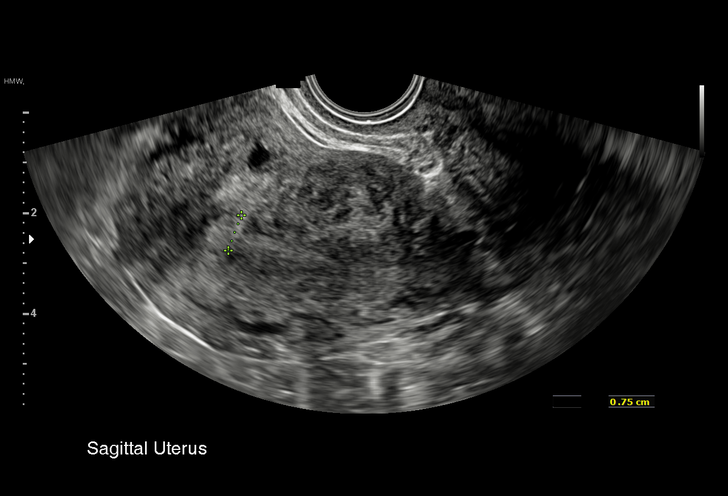
[im 28/55]
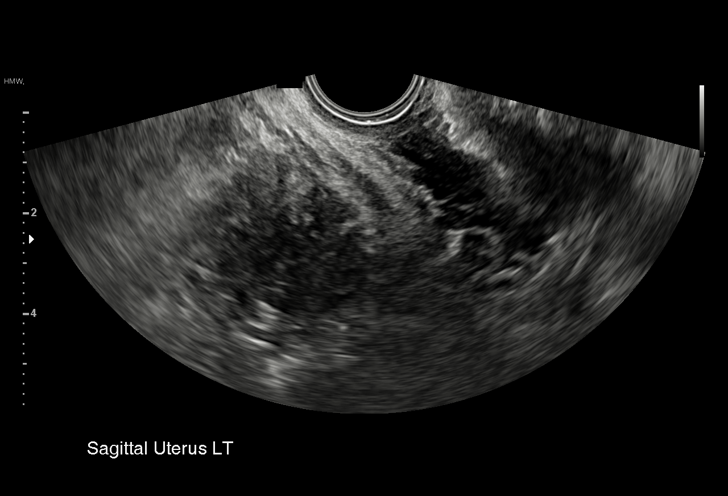
[im 32/55]
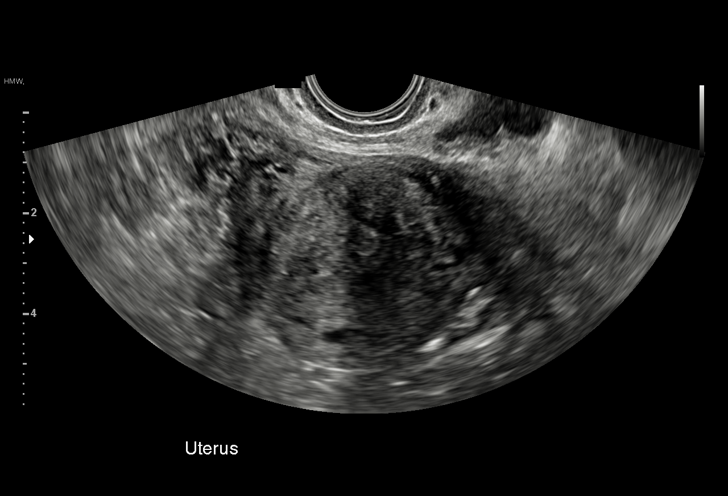
[im 34/55]
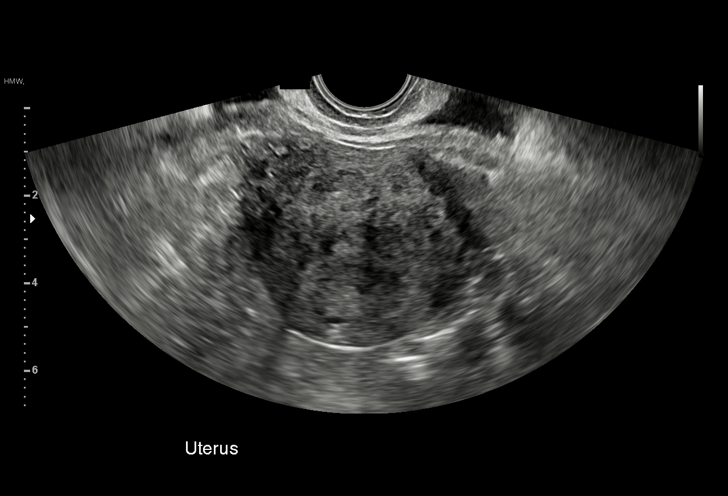
[im 39/55]
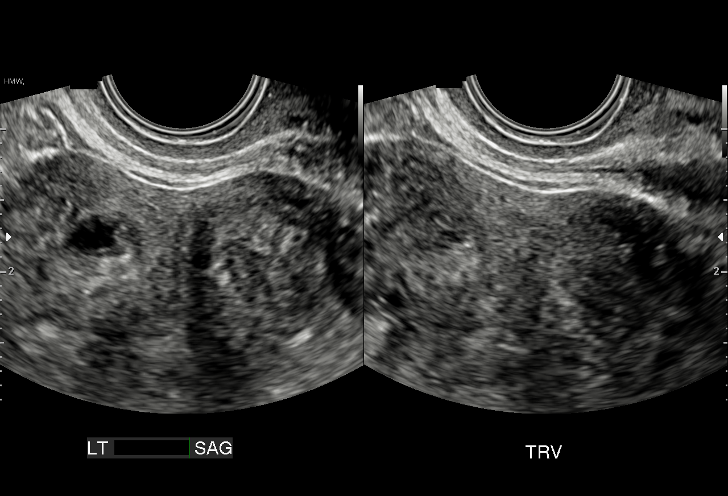
[im 43/55]
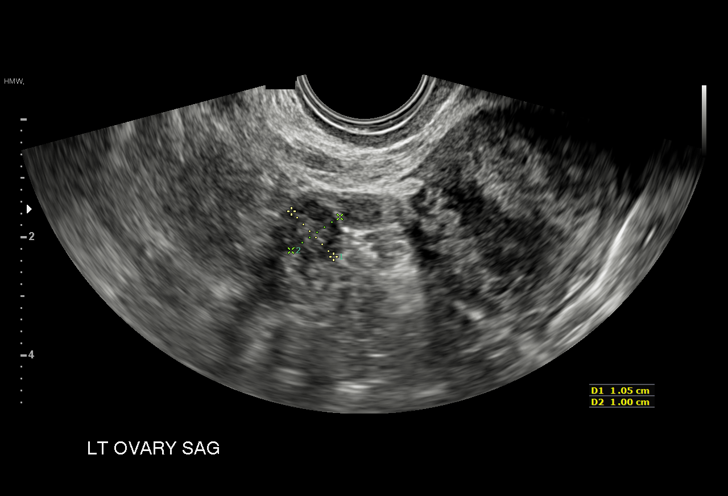
[im 46/55]
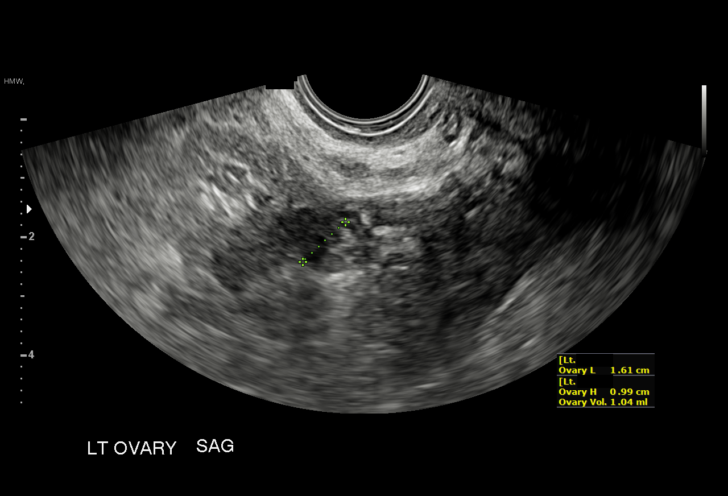
[im 50/55]
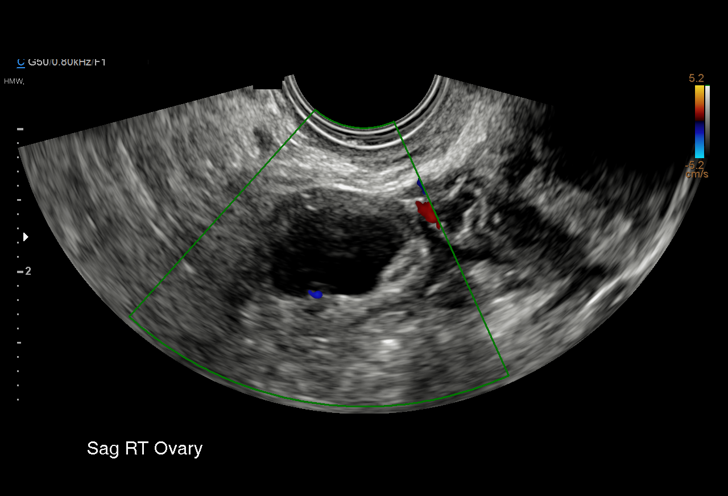
[im 55/55]
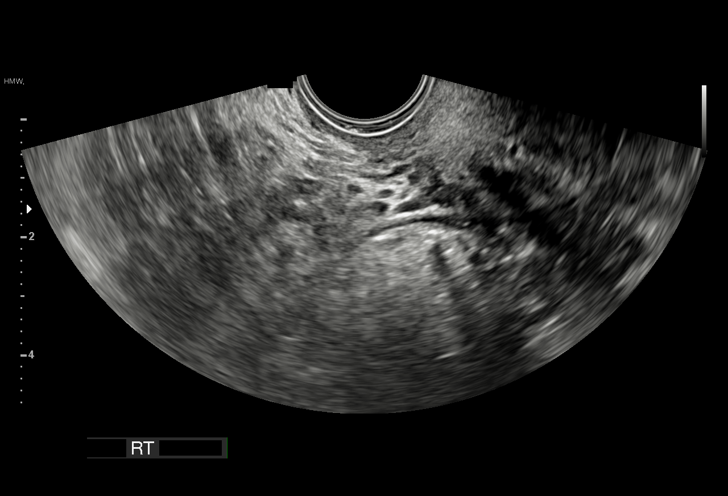

[15 of 25 positions shown; findings below may reference images not displayed]

FINDINGS: Uterus

Measurements: 9.2 x 4.3 x 5.0 cm = volume: 103 mL. Anteverted.
Heterogeneous myometrium. Question prior Caesarean section. Several
small uterine leiomyomata are identified, including a 2.6 x 2.1 x
2.1 cm diameter intramural leiomyoma at lower uterine segment
anteriorly question extending submucosal and a degenerated partially
cystic intramural leiomyoma at the anterior upper uterus 1.2 x 1.0 x
1.1 cm.

Endometrium

Thickness: 6 mm.  No endometrial fluid or focal abnormality

Right ovary

Measurements: 2.3 x 2.1 x 3.0 cm = volume: 7.5 mL. Dominant follicle
without mass

Left ovary

Measurements: 1.6 x 1.0 x 1.3 cm = volume: 1.0 mL. Normal morphology
without mass

Other findings

No free pelvic fluid or adnexal masses.
IMPRESSION: 2 small uterine leiomyomata as above.

Otherwise normal exam.

## 2020-06-21 DIAGNOSIS — Z20822 Contact with and (suspected) exposure to covid-19: Secondary | ICD-10-CM | POA: Diagnosis not present

## 2020-06-22 DIAGNOSIS — Z20822 Contact with and (suspected) exposure to covid-19: Secondary | ICD-10-CM | POA: Diagnosis not present

## 2021-05-03 ENCOUNTER — Encounter: Payer: Self-pay | Admitting: Family Medicine

## 2021-05-03 ENCOUNTER — Ambulatory Visit: Payer: BC Managed Care – PPO | Admitting: Family Medicine

## 2021-05-03 ENCOUNTER — Other Ambulatory Visit: Payer: Self-pay

## 2021-05-03 VITALS — BP 126/93 | HR 69 | Temp 97.9°F | Wt 195.6 lb

## 2021-05-03 DIAGNOSIS — L0291 Cutaneous abscess, unspecified: Secondary | ICD-10-CM | POA: Diagnosis not present

## 2021-05-03 DIAGNOSIS — L309 Dermatitis, unspecified: Secondary | ICD-10-CM

## 2021-05-03 MED ORDER — NYSTATIN-TRIAMCINOLONE 100000-0.1 UNIT/GM-% EX CREA: 1.0000 "application " | TOPICAL_CREAM | Freq: Two times a day (BID) | CUTANEOUS | 0 refills | Status: AC

## 2021-05-03 MED ORDER — DOXYCYCLINE HYCLATE 100 MG PO TABS: 100.0000 mg | ORAL_TABLET | Freq: Two times a day (BID) | ORAL | 0 refills | Status: DC

## 2021-05-03 NOTE — Assessment & Plan Note (Signed)
Mild flare.  Will refill nystatin-triamcinolone cream.  This is worked well for her in the past.

## 2021-05-03 NOTE — Progress Notes (Signed)
   Dorothy Flores is a 45 y.o. female who presents today for an office visit.  Assessment/Plan:  New/Acute Problems: Abscess I&D performed today.  See below procedure note.  She tolerated.  Likely due to acne/folliculitis.  We will start doxycycline.  She will let me know if not improving in the next 1 to 2 weeks and may consider referral to dermatology at that time.  Chronic Problems Addressed Today: Eczema Mild flare.  Will refill nystatin-triamcinolone cream.  This is worked well for her in the past.     Subjective:  HPI:  Patient here with painful lesion on left neck.  Started a few days ago.  Has had a raised spot to the area.  No obvious precipitating events.  Worse when opening jaw.  No specific treatments tried.  No fevers or chills.  She is also had some dryness her back and upper extremities.        Objective:  Physical Exam: BP (!) 126/93   Pulse 69   Temp 97.9 F (36.6 C) (Temporal)   Wt 195 lb 9.6 oz (88.7 kg)   LMP 04/06/2021   SpO2 100%   BMI 34.65 kg/m   Gen: No acute distress, resting comfortably CV: Regular rate and rhythm with no murmurs appreciated Pulm: Normal work of breathing, clear to auscultation bilaterally with no crackles, wheezes, or rhonchi Skin: Approximately 1 cm fluctuant mass on left anterior neck consistent with abscess.  Overlying erythema.  No spreading erythema. Neuro: Grossly normal, moves all extremities Psych: Normal affect and thought content  Incision and Drainage Procedure Note  Pre-operative Diagnosis: Abscess  Post-operative Diagnosis: same  Indications: Therapeutic  Anesthesia: 1% lidocaine with epinephrine  Procedure Details  The procedure, risks and complications have been discussed in detail (including, but not limited to airway compromise, infection, bleeding) with the patient, and the patient has signed consent to the procedure.  The skin was sterilely prepped and draped over the affected area in the usual  fashion. After adequate local anesthesia, I&D with a #11 blade was performed on the Abscess . Purulent drainage: present The patient was observed until stable.  Findings: Abscess  EBL: 2 cc's  Condition: Tolerated procedure well   Complications: none.        I,Jordan Kelly,acting as a Education administrator for Dimas Chyle, MD.,have documented all relevant documentation on the behalf of Dimas Chyle, MD,as directed by  Dimas Chyle, MD while in the presence of Dimas Chyle, MD.  I, Dimas Chyle, MD, have reviewed all documentation for this visit. The documentation on 05/03/21 for the exam, diagnosis, procedures, and orders are all accurate and complete.  Algis Greenhouse. Jerline Pain, MD 05/03/2021 12:54 PM

## 2021-05-03 NOTE — Patient Instructions (Signed)
It was very nice to see you today!  Please start the doxycycline.  We  drained the abscess on your neck  Let me know if your symptoms are not improving over the next 1 to 2 weeks.  Take care, Dr Jerline Pain  PLEASE NOTE:  If you had any lab tests please let us know if you have not heard back within a few days. You may see your results on mychart before we have a chance to review them but we will give you a call once they are reviewed by [place]. If we ordered any referrals today, please let us know if you have not heard from their office within the next week.   Please try these tips to maintain a healthy lifestyle:  Eat at least 3 REAL meals and 1-2 snacks per day.  Aim for no more than 5 hours between eating.  If you eat breakfast, please do so within one hour of getting up.   Each meal should contain half fruits/vegetables, one quarter protein, and one quarter carbs (no bigger than a computer mouse)  Cut down on sweet beverages. This includes juice, soda, and sweet tea.   Drink at least 1 glass of water with each meal and aim for at least 8 glasses per day  Exercise at least 150 minutes every week.

## 2021-09-02 ENCOUNTER — Ambulatory Visit: Payer: BC Managed Care – PPO | Admitting: Family Medicine

## 2021-09-02 ENCOUNTER — Encounter: Payer: Self-pay | Admitting: Family Medicine

## 2021-09-02 VITALS — BP 117/80 | HR 65 | Temp 98.0°F | Ht 63.0 in | Wt 197.8 lb

## 2021-09-02 DIAGNOSIS — N946 Dysmenorrhea, unspecified: Secondary | ICD-10-CM | POA: Insufficient documentation

## 2021-09-02 DIAGNOSIS — D219 Benign neoplasm of connective and other soft tissue, unspecified: Secondary | ICD-10-CM | POA: Diagnosis not present

## 2021-09-02 DIAGNOSIS — L309 Dermatitis, unspecified: Secondary | ICD-10-CM

## 2021-09-02 NOTE — Progress Notes (Signed)
? ?  Dorothy Flores is a 46 y.o. female who presents today for an office visit. ? ?Assessment/Plan:  ?Chronic Problems Addressed Today: ?Dysmenorrhea ?Likely due to her fibroids. we discussed starting NSAIDs however she declined.  She does not want to be on any medications at this point. IShe requests referral to gynecology.  We will place referral today.   ? ?Fibroid ?Will refer to OBGYN.  We discussed medications to help with symptoms however she declined. ? ?Eczema ?Stable.  Uses triamcinolone as needed.  Does not need refill today. ? ? ?  ?Subjective:  ?HPI: ? ?Patient here with painful periods.  Located in right lower abdomen radiating into her back.  Seems to be worsening.  Symptoms getting worse for the past few months. She has noticed heavy period and blood clots when this starts. No treatment tried. She has been feeling more fatigue and sweats during the night and is concerned about going through menopause.  Her pain usually resolves when her period starts. . the No nausea or vomiting. No other obvious alleviating or aggravating factors. Some itch.  She does have hx of uterine fibroids. ? ? ?   ?  ?Objective:  ?Physical Exam: ?BP 117/80 (BP Location: Left Arm)   Pulse 65   Temp 98 ?F (36.7 ?C) (Temporal)   Ht '5\' 3"'$  (1.6 m)   Wt 197 lb 12.8 oz (89.7 kg)   SpO2 100%   BMI 35.04 kg/m?   ?Gen: No acute distress, resting comfortably ?Neuro: Grossly normal, moves all extremities ?Psych: Normal affect and thought content ? ?   ? ? ?I,Savera Zaman,acting as a Education administrator for Dimas Chyle, MD.,have documented all relevant documentation on the behalf of Dimas Chyle, MD,as directed by  Dimas Chyle, MD while in the presence of Dimas Chyle, MD.  ? ?I, Dimas Chyle, MD, have reviewed all documentation for this visit. The documentation on 09/02/21 for the exam, diagnosis, procedures, and orders are all accurate and complete. ? ?Algis Greenhouse. Jerline Pain, MD ?09/02/2021 1:42 PM  ? ?

## 2021-09-02 NOTE — Assessment & Plan Note (Signed)
Stable.  Uses triamcinolone as needed.  Does not need refill today. ?

## 2021-09-02 NOTE — Patient Instructions (Signed)
It was very nice to see you today! ? ?You have fibroids.  We will refer you to see a gynecologist to further evaluate.  Please let us know if you would like to start medication help with this. ? ?Take care, ?Dr Jerline Pain ? ?PLEASE NOTE: ? ?If you had any lab tests please let us know if you have not heard back within a few days. You may see your results on mychart before we have a chance to review them but we will give you a call once they are reviewed by [place]. If we ordered any referrals today, please let us know if you have not heard from their office within the next week.  ? ?Please try these tips to maintain a healthy lifestyle: ? ?Eat at least 3 REAL meals and 1-2 snacks per day.  Aim for no more than 5 hours between eating.  If you eat breakfast, please do so within one hour of getting up.  ? ?Each meal should contain half fruits/vegetables, one quarter protein, and one quarter carbs (no bigger than a computer mouse) ? ?Cut down on sweet beverages. This includes juice, soda, and sweet tea.  ? ?Drink at least 1 glass of water with each meal and aim for at least 8 glasses per day ? ?Exercise at least 150 minutes every week.   ?

## 2021-09-02 NOTE — Assessment & Plan Note (Signed)
Will refer to OBGYN.  We discussed medications to help with symptoms however she declined. ?

## 2021-09-02 NOTE — Assessment & Plan Note (Signed)
Likely due to her fibroids. we discussed starting NSAIDs however she declined.  She does not want to be on any medications at this point. IShe requests referral to gynecology.  We will place referral today.   ?

## 2022-01-14 DIAGNOSIS — R5383 Other fatigue: Secondary | ICD-10-CM | POA: Diagnosis not present

## 2022-01-14 DIAGNOSIS — K219 Gastro-esophageal reflux disease without esophagitis: Secondary | ICD-10-CM | POA: Diagnosis not present

## 2022-01-14 DIAGNOSIS — N926 Irregular menstruation, unspecified: Secondary | ICD-10-CM | POA: Diagnosis not present

## 2022-02-10 DIAGNOSIS — N921 Excessive and frequent menstruation with irregular cycle: Secondary | ICD-10-CM | POA: Diagnosis not present

## 2022-02-10 DIAGNOSIS — Z01419 Encounter for gynecological examination (general) (routine) without abnormal findings: Secondary | ICD-10-CM | POA: Diagnosis not present

## 2022-02-10 DIAGNOSIS — E538 Deficiency of other specified B group vitamins: Secondary | ICD-10-CM | POA: Diagnosis not present

## 2022-02-10 DIAGNOSIS — Z6836 Body mass index (BMI) 36.0-36.9, adult: Secondary | ICD-10-CM | POA: Diagnosis not present

## 2022-02-10 DIAGNOSIS — Z0142 Encounter for cervical smear to confirm findings of recent normal smear following initial abnormal smear: Secondary | ICD-10-CM | POA: Diagnosis not present

## 2022-02-10 DIAGNOSIS — R5383 Other fatigue: Secondary | ICD-10-CM | POA: Diagnosis not present

## 2022-02-10 DIAGNOSIS — E559 Vitamin D deficiency, unspecified: Secondary | ICD-10-CM | POA: Diagnosis not present

## 2022-02-18 NOTE — Progress Notes (Unsigned)
    Benito Mccreedy D.[place] Phone: [phone]   Assessment and Plan:     There are no diagnoses linked to this encounter.  ***   Pertinent previous records reviewed include ***   Follow Up: ***     Subjective:   I, Annaleah Arata, am serving as a Education administrator for Doctor Glennon Mac  Chief Complaint: right ankle pain   HPI:   02/21/2022 Patient is a 46 year old female complaining of right ankle pain. Patient states  Relevant Historical Information: ***  Additional pertinent review of systems negative.   Current Outpatient Medications:    nystatin-triamcinolone (MYCOLOG II) cream, Apply 1 application topically 2 (two) times daily. Apply to affected area BID for up to 7 days., Disp: 60 g, Rfl: 0   Objective:     There were no vitals filed for this visit.    There is no height or weight on file to calculate BMI.    Physical Exam:    ***   Electronically signed by:  Benito Mccreedy D.[facility] 9:45 AM 02/18/22

## 2022-02-21 ENCOUNTER — Ambulatory Visit: Payer: BC Managed Care – PPO | Admitting: Sports Medicine

## 2022-02-21 VITALS — BP 110/68 | HR 74 | Ht 63.0 in | Wt 202.0 lb

## 2022-02-21 DIAGNOSIS — S86311A Strain of muscle(s) and tendon(s) of peroneal muscle group at lower leg level, right leg, initial encounter: Secondary | ICD-10-CM

## 2022-02-21 MED ORDER — MELOXICAM 15 MG PO TABS: 15.0000 mg | ORAL_TABLET | Freq: Every day | ORAL | 0 refills | Status: AC

## 2022-02-21 NOTE — Patient Instructions (Addendum)
Good to see you  - Start meloxicam 15 mg daily x2 weeks.  If still having pain after 2 weeks, complete 3rd-week of meloxicam. May use remaining meloxicam as needed once daily for pain control.  Do not to use additional NSAIDs while taking meloxicam.  May use Tylenol [phone] mg 2 to 3 times a day for breakthrough pain. Recommend wearing supportive tennis shoes for 2 weeks NO FLIP FLOPS  As needed follow up

## 2022-05-26 ENCOUNTER — Encounter: Payer: Self-pay | Admitting: *Deleted

## 2022-07-22 DIAGNOSIS — E559 Vitamin D deficiency, unspecified: Secondary | ICD-10-CM | POA: Diagnosis not present

## 2022-08-08 DIAGNOSIS — M25561 Pain in right knee: Secondary | ICD-10-CM | POA: Diagnosis not present

## 2022-09-15 ENCOUNTER — Telehealth: Payer: Self-pay | Admitting: Family Medicine

## 2022-09-15 NOTE — Telephone Encounter (Signed)
Patient states: - BP readings have been elevated; systolic number getting up to 145 - Has been having pain under left breast x 2 days-- increasingly worse - Has also been having nose bleeds  Due to patient hanging up prior to transfer, Jenny Reichmann informed me that she would have the triage nurse give her a call.

## 2022-09-15 NOTE — Telephone Encounter (Signed)
Spoke with pt. She has decided to seek out a new provider.  Routing to clinical lead & management.

## 2022-09-15 NOTE — Telephone Encounter (Signed)
Please have be seen here ASAP.  Dorothy Flores. Jerline Pain, MD 09/15/2022 12:39 PM

## 2022-09-15 NOTE — Telephone Encounter (Signed)
Final disposition: Triage nurse unable to contact patient.  Patient Name: Dorothy Flores Gender: Female DOB: [birth year] Age: 47 Y 3 M 30 D Return Phone Number: [phone] (Primary) Address: City/ State/ Zip: [place] [name] [place] [facility] Provider Dimas Chyle- MD Contact Type Call Who Is Calling Patient / Member / Family / Caregiver Call Type Triage / Clinical Relationship To Patient Self Return Phone Number [phone] (Primary) Chief Complaint CHEST PAIN - pain, pressure, heaviness or tightness Reason for Call Symptomatic / Request for [place] is calling from the office. The patient's blood pressure has been high lately. Her systolic has been between 135-145. She has been having pain under her left breast for 2 days. Caller is not on the phone. She has also been having nosebleeds. The office wants her triaged before scheduling her. Translation No Disp. Time Eilene Ghazi Time) Disposition Final User 09/15/2022 11:17:48 AM Send to Urgent Lyda Jester 09/15/2022 11:20:48 AM Attempt made - message left Lenon Curt, RN, Threasa Beards 09/15/2022 11:36:40 AM Attempt made - message left Lenon Curt, RN, Threasa Beards 09/15/2022 11:52:00 AM FINAL ATTEMPT MADE - message left Yes Lenon Curt, RN, Melanie Final Disposition 09/15/2022 11:52:00 AM FINAL ATTEMPT MADE - message left Yes Lenon Curt, RN, [employer]

## 2022-09-20 DIAGNOSIS — R7989 Other specified abnormal findings of blood chemistry: Secondary | ICD-10-CM | POA: Diagnosis not present

## 2022-09-20 DIAGNOSIS — E559 Vitamin D deficiency, unspecified: Secondary | ICD-10-CM | POA: Diagnosis not present

## 2022-09-20 DIAGNOSIS — N951 Menopausal and female climacteric states: Secondary | ICD-10-CM | POA: Diagnosis not present

## 2022-09-20 DIAGNOSIS — R030 Elevated blood-pressure reading, without diagnosis of hypertension: Secondary | ICD-10-CM | POA: Diagnosis not present

## 2022-09-20 DIAGNOSIS — R7303 Prediabetes: Secondary | ICD-10-CM | POA: Diagnosis not present

## 2022-09-20 DIAGNOSIS — Z13220 Encounter for screening for lipoid disorders: Secondary | ICD-10-CM | POA: Diagnosis not present

## 2022-09-20 DIAGNOSIS — Z1330 Encounter for screening examination for mental health and behavioral disorders, unspecified: Secondary | ICD-10-CM | POA: Diagnosis not present

## 2022-10-24 DIAGNOSIS — Z1211 Encounter for screening for malignant neoplasm of colon: Secondary | ICD-10-CM | POA: Diagnosis not present

## 2022-10-24 DIAGNOSIS — R7989 Other specified abnormal findings of blood chemistry: Secondary | ICD-10-CM | POA: Diagnosis not present

## 2022-10-31 DIAGNOSIS — Z1211 Encounter for screening for malignant neoplasm of colon: Secondary | ICD-10-CM | POA: Diagnosis not present

## 2022-11-05 LAB — COLOGUARD: COLOGUARD: NEGATIVE

## 2023-04-12 DIAGNOSIS — Z1231 Encounter for screening mammogram for malignant neoplasm of breast: Secondary | ICD-10-CM | POA: Diagnosis not present

## 2023-04-12 DIAGNOSIS — Z01419 Encounter for gynecological examination (general) (routine) without abnormal findings: Secondary | ICD-10-CM | POA: Diagnosis not present

## 2023-04-12 DIAGNOSIS — Z1331 Encounter for screening for depression: Secondary | ICD-10-CM | POA: Diagnosis not present

## 2024-03-15 ENCOUNTER — Ambulatory Visit: Admitting: Physician Assistant

## 2024-03-15 ENCOUNTER — Other Ambulatory Visit (INDEPENDENT_AMBULATORY_CARE_PROVIDER_SITE_OTHER): Payer: Self-pay

## 2024-03-15 ENCOUNTER — Encounter: Payer: Self-pay | Admitting: Physician Assistant

## 2024-03-15 DIAGNOSIS — M25561 Pain in right knee: Secondary | ICD-10-CM | POA: Diagnosis not present

## 2024-03-15 MED ORDER — METHYLPREDNISOLONE ACETATE 40 MG/ML IJ SUSP
40.0000 mg | INTRAMUSCULAR | Status: AC | PRN
  Administered 2024-03-15: 40 mg via INTRA_ARTICULAR

## 2024-03-15 MED ORDER — LIDOCAINE HCL 1 % IJ SOLN
3.0000 mL | INTRAMUSCULAR | Status: AC | PRN
  Administered 2024-03-15: 3 mL

## 2024-03-15 NOTE — Addendum Note (Signed)
 Addended by: RODGERS LACY on: 03/15/2024 04:08 PM   Modules accepted: Orders

## 2024-03-15 NOTE — Progress Notes (Signed)
 Office Visit Note   Patient: Dorothy Flores           Date of Birth: [birth year]           MRN: [id number] Visit Date: 03/15/2024              Requested by: Kennyth Worth HERO, MD [address] [place],  [place] [address] PCP: Kennyth Worth HERO, MD   Assessment & Plan: Visit Diagnoses:  1. Acute pain of right knee     Plan: Patient with chief complaint of right knee pain.  She has had a couple falls in the last year but none recently she just began having knee pain 3 to 4 days ago which she describes as burning.  Exam is fairly benign she.  I recommended topical Voltaren gel she is already taking an anti-inflammatory discal therapy and discussed going forward with an injection she would like to do that today  Follow-Up Instructions: No follow-ups on file.   Orders:  Orders Placed This Encounter  Procedures  . XR KNEE 3 VIEW RIGHT   No orders of the defined types were placed in this encounter.     Procedures: Large Joint Inj: R knee on 03/15/2024 1:51 PM Indications: pain and diagnostic evaluation Details: 25 G 1.5 in needle, anteromedial approach  Arthrogram: No  Medications: 40 mg methylPREDNISolone  acetate 40 MG/ML; 3 mL lidocaine 1 % Outcome: tolerated well, no immediate complications Procedure, treatment alternatives, risks and benefits explained, specific risks discussed. Consent was given by the patient.      Clinical Data: No additional findings.   Subjective: No chief complaint on file.   HPI pleasant 48 year old woman comes in with a 3-4 history of right knee pain.  No particular recent injury has had a couple falls on this knee.  Just awoken with pain.  She describes it as burning more on the medial side of the knee.  No fever or chills  Review of Systems  All other systems reviewed and are negative.    Objective: Vital Signs: There were no vitals taken for this visit.  Physical Exam Constitutional:      Appearance: Normal appearance.   Pulmonary:     Effort: Pulmonary effort is normal.  Skin:    General: Skin is warm and dry.  Neurological:     General: No focal deficit present.     Mental Status: She is alert and oriented to person, place, and time.  Psychiatric:        Mood and Affect: Mood normal.        Behavior: Behavior normal.     Ortho Exam Right knee no effusion no erythema compartments are soft and compressible she is neuro vastly intact she has more tenderness over the medial than lateral joint line she has negative Homans' sign no evidence of cellulitis or infection good varus valgus anterior posterior stability Specialty Comments:  No specialty comments available.  Imaging: No results found.   PMFS History: Patient Active Problem List   Diagnosis Date Noted  . Fibroid 09/02/2021  . Dysmenorrhea 09/02/2021  . Eczema 05/03/2021  . Obesity (BMI 35.0-39.9 without comorbidity) 05/08/2017   Past Medical History:  Diagnosis Date  . Fibroid     Family History  Problem Relation Age of Onset  . Heart attack Mother   . Cancer Father        Mouth Cancer  . Heart disease Maternal Grandmother   . Heart disease Maternal Grandfather  Past Surgical History:  Procedure Laterality Date  . CESAREAN SECTION  2005  . MYOMECTOMY  2004   done in [place]  . MYOMECTOMY ABDOMINAL APPROACH     Social History   Occupational History  . Occupation: Self Employed  Tobacco Use  . Smoking status: Never  . Smokeless tobacco: Never  Vaping Use  . Vaping status: Never Used  Substance and Sexual Activity  . Alcohol use: No  . Drug use: No  . Sexual activity: Yes    Birth control/protection: Condom    Comment: condoms sometimes

## 2024-03-30 NOTE — Therapy (Signed)
 OUTPATIENT PHYSICAL THERAPY LOWER EXTREMITY EVALUATION   Patient Name: Dorothy Flores MRN: [id number] DOB:1975/08/04, 48 y.o., female Today's Date: 04/01/2024  END OF SESSION:  PT End of Session - 04/01/24 1442     Visit Number 1    Number of Visits 7    Date for Recertification  05/13/24    PT Start Time 1350    PT Stop Time 1425    PT Time Calculation (min) 35 min    Activity Tolerance Patient tolerated treatment well    Behavior During Therapy [facility] for tasks assessed/performed          Past Medical History:  Diagnosis Date   Fibroid    Past Surgical History:  Procedure Laterality Date   CESAREAN SECTION  2005   MYOMECTOMY  2004   done in [place]   MYOMECTOMY ABDOMINAL APPROACH     Patient Active Problem List   Diagnosis Date Noted   Fibroid 09/02/2021   Dysmenorrhea 09/02/2021   Eczema 05/03/2021   Obesity (BMI 35.0-39.9 without comorbidity) 05/08/2017    PCP: Kennyth Worth HERO, MD   REFERRING PROVIDER: Persons, Ronal Dragon, [place]   REFERRING DIAG: [phone] (ICD-10-CM) - Acute pain of right knee   THERAPY DIAG:  Acute pain of right knee  Stiffness of right knee, not elsewhere classified  Rationale for Evaluation and Treatment: Rehabilitation  ONSET DATE: September 2025  SUBJECTIVE:   SUBJECTIVE STATEMENT: Pt c/o having R knee pain that started in September.  Had an injection in early October and the pain decreased and motion increased.  Had some  burning but now better. Still feels a little sore and wants to keep pain from coming back.  PERTINENT HISTORY: No previous R knee surgeries PAIN:  Are you having pain? Yes: NPRS scale: 7/10 before injection then 3/10 Pain location: medial R knee  Pain description: burning, stiff Aggravating factors: sleeping, walking, driving Relieving factors: injection  PRECAUTIONS: None  RED FLAGS: None   WEIGHT BEARING RESTRICTIONS: No  FALLS:  Has patient fallen in last 6 months? No,yes for the  year  LIVING ENVIRONMENT: Lives with: lives with their spouse Lives in: House/apartment Stairs: Yes: Internal: 12  steps; on left going up Has following equipment at home: None  OCCUPATION: self employed- goes to an office  PLOF: Independent  PATIENT GOALS: decrease pain with walking and praying  NEXT MD VISIT: unknown  OBJECTIVE:  Note: Objective measures were completed at Evaluation unless otherwise noted.  DIAGNOSTIC FINDINGS: 03/15/24 Radiographs of her knee demonstrate well-maintained alignment very early  degenerative changes no acute osseous changes   PATIENT SURVEYS:  PSFS: THE PATIENT SPECIFIC FUNCTIONAL SCALE  Place score of 0-10 (0 = unable to perform activity and 10 = able to perform activity at the same level as before injury or problem)  Activity Date: 04/01/24    Drive car 7    2.walking  10    3.sleeping  8    4.      Total Score 8.33      Total Score = Sum of activity scores/number of activities  Minimally Detectable Change: 3 points (for single activity); 2 points (for average score)  [employer] (nd). The Patient Specific Functional Scale . Retrieved from [email or web address]   COGNITION: Overall cognitive status: Within functional limits for tasks assessed     SENSATION: WFL  EDEMA:  N/A  MUSCLE LENGTH: Hamstrings: Right mild restriction   POSTURE: No Significant postural limitations  PALPATION: 1+ tenderness medial jt  line  LOWER EXTREMITY ROM:  Active/Passive ROM Right eval Left eval  Hip flexion    Hip extension    Hip abduction    Hip adduction    Hip internal rotation    Hip external rotation    Knee flexion 120   Knee extension 0   Ankle dorsiflexion    Ankle plantarflexion    Ankle inversion    Ankle eversion     (Blank rows = not tested)  LOWER EXTREMITY MMT:  MMT Right eval Left eval  Hip flexion 4+   Hip extension    Hip abduction 4+   Hip  adduction    Hip internal rotation    Hip external rotation    Knee flexion 4+   Knee extension 4   Ankle dorsiflexion 5-   Ankle plantarflexion    Ankle inversion    Ankle eversion     (Blank rows = not tested)  LOWER EXTREMITY SPECIAL TESTS:  Bounce neg  FUNCTIONAL TESTS:  5 times sit to stand: 13 sec with some UE use  GAIT: Distance walked: short community  Assistive device utilized: None Level of assistance: Complete Independence Comments: ---                                                                                                                                TREATMENT DATE:  04/01/24 initial evaluation of right knee completed followed by instruction and trial set of HEP.   PATIENT EDUCATION:  Education details: HEP Person educated: Patient Education method: Solicitor, Actor cues, and Verbal cues Education comprehension: verbalized understanding, returned demonstration, and verbal cues required  HOME EXERCISE PROGRAM: Access Code: [email or web address] URL: [email or web address] Date: 04/01/2024 Prepared by: Burnard Meth  Exercises - Supine Quad Set  - 2 x daily - 7 x weekly - 2 sets - 10 reps - Supine Active Straight Leg Raise  - 2 x daily - 7 x weekly - 2 sets - 10 reps - Sidelying Hip Abduction  - 2 x daily - 7 x weekly - 2 sets - 10 reps - Supine Bridge  - 2 x daily - 7 x weekly - 2 sets - 10 reps  ASSESSMENT:  CLINICAL IMPRESSION: Patient is a 48y.o. female who was seen today for physical therapy evaluation and treatment for right knee pain and restriction.  She presents with decreased strength and pain. Patient would benefit from skilled physical therapy to address these deficits and return to previous LOA.  OBJECTIVE IMPAIRMENTS: decreased endurance, decreased strength, and pain.   ACTIVITY LIMITATIONS: standing, squatting, and sleeping  PARTICIPATION LIMITATIONS: occupation  PERSONAL FACTORS: N/A are also affecting  patient's functional outcome.   REHAB POTENTIAL: Good  CLINICAL DECISION MAKING: Stable/uncomplicated  EVALUATION COMPLEXITY: Low   GOALS: Goals reviewed with patient? Yes  SHORT TERM /LONG TERM GOALS: Target date: 05/13/2024   weeks   Patient to be independent with HEP. Baseline: Goal status: INITIAL 2.  Decrease max pain to 2 out of 10 or less. Baseline:  Goal status: INITIAL  3. Increase strength by half a grade to 1 full grade. Baseline:  Goal status: INITIAL  5.  Increase score of PSFS by 2-3 points. Baseline:  Goal status: INITIAL  PLAN:  PT FREQUENCY: 1x/week  PT DURATION: 6 weeks  PLANNED INTERVENTIONS: 97164- PT Re-evaluation, 97110-Therapeutic exercises, 97530- Therapeutic activity, [id number]- Neuromuscular re-education, 97535- Self Care, [id number]- Manual therapy, Patient/Family education, and Balance training  PLAN FOR NEXT SESSION: Review and Increase HEP.  Note: Due to high co pay patient reported at end of evaluation she did not want to return.  PT told patient she would benefit from a few sessions to increase and monitor HEP progression.  Patient will try to make an appointment in 2-3 weeks.   Burnard CHRISTELLA Meth, PT 04/01/2024, 2:44 PM

## 2024-04-01 ENCOUNTER — Other Ambulatory Visit: Payer: Self-pay

## 2024-04-01 ENCOUNTER — Ambulatory Visit

## 2024-04-01 DIAGNOSIS — M25661 Stiffness of right knee, not elsewhere classified: Secondary | ICD-10-CM

## 2024-04-01 DIAGNOSIS — M25561 Pain in right knee: Secondary | ICD-10-CM | POA: Diagnosis not present

## 2024-04-15 ENCOUNTER — Encounter: Payer: Self-pay | Admitting: Radiology

## 2024-04-21 NOTE — Therapy (Signed)
 OUTPATIENT PHYSICAL THERAPY LOWER EXTREMITY TREATMENT/DISCHARGE  Patient Name: Dorothy Flores MRN: [id number] DOB:26-Sep-1975, 48 y.o., female Today's Date: 04/22/2024  END OF SESSION:  PT End of Session - 04/22/24 1505     Visit Number 2    Date for Recertification  05/13/24    PT Start Time 1345    PT Stop Time 1425    PT Time Calculation (min) 40 min    Activity Tolerance Patient tolerated treatment well    Behavior During Therapy [facility] for tasks assessed/performed           Past Medical History:  Diagnosis Date   Fibroid    Past Surgical History:  Procedure Laterality Date   CESAREAN SECTION  2005   MYOMECTOMY  2004   done in [place]   MYOMECTOMY ABDOMINAL APPROACH     Patient Active Problem List   Diagnosis Date Noted   Fibroid 09/02/2021   Dysmenorrhea 09/02/2021   Eczema 05/03/2021   Obesity (BMI 35.0-39.9 without comorbidity) 05/08/2017    PCP: Kennyth Worth HERO, MD   REFERRING PROVIDER: Persons, Ronal Dragon, [place]   REFERRING DIAG: [phone] (ICD-10-CM) - Acute pain of right knee   THERAPY DIAG:  Acute pain of right knee  Stiffness of right knee, not elsewhere classified  Rationale for Evaluation and Treatment: Rehabilitation  ONSET DATE: September 2025  SUBJECTIVE:   SUBJECTIVE STATEMENT: Pt states her knee is much better.  She even traveled by car to [place]  without knee pain/stiffness.  PERTINENT HISTORY: No previous R knee surgeries PAIN:  Are you having pain? Yes: NPRS scale: 1/31max Pain location: medial R knee  Pain description: burning, stiff Aggravating factors: sleeping, walking, driving Relieving factors: injection  PRECAUTIONS: None  RED FLAGS: None   WEIGHT BEARING RESTRICTIONS: No  FALLS:  Has patient fallen in last 6 months? No,yes for the year  LIVING ENVIRONMENT: Lives with: lives with their spouse Lives in: House/apartment Stairs: Yes: Internal: 12  steps; on left going up Has following equipment at home:  None  OCCUPATION: self employed- goes to an office  PLOF: Independent  PATIENT GOALS: decrease pain with walking and praying  NEXT MD VISIT: unknown  OBJECTIVE:  Note: Objective measures were completed at Evaluation unless otherwise noted.  DIAGNOSTIC FINDINGS: 03/15/24 Radiographs of her knee demonstrate well-maintained alignment very early  degenerative changes no acute osseous changes   PATIENT SURVEYS:  PSFS: THE PATIENT SPECIFIC FUNCTIONAL SCALE  Place score of 0-10 (0 = unable to perform activity and 10 = able to perform activity at the same level as before injury or problem)  Activity Date: 04/01/24 Date  04/22/24   Drive car 7 9   2.walking  10 10   3.sleeping  8 9   4.      Total Score 8.33 9.33     Total Score = Sum of activity scores/number of activities  Minimally Detectable Change: 3 points (for single activity); 2 points (for average score)  [employer] [facility] (nd). The Patient Specific Functional Scale . Retrieved from [email or web address]   COGNITION: Overall cognitive status: Within functional limits for tasks assessed     SENSATION: WFL  EDEMA:  N/A  MUSCLE LENGTH: Hamstrings: Right mild restriction   POSTURE: No Significant postural limitations  PALPATION: 1+ tenderness medial jt line  LOWER EXTREMITY ROM:  Active/Passive ROM Right eval Left eval  Hip flexion    Hip extension    Hip abduction    Hip adduction    Hip internal rotation  Hip external rotation    Knee flexion 120   Knee extension 0   Ankle dorsiflexion    Ankle plantarflexion    Ankle inversion    Ankle eversion     (Blank rows = not tested)  LOWER EXTREMITY MMT:  MMT Right eval Left eval  Hip flexion 5-   Hip extension    Hip abduction 5-   Hip adduction    Hip internal rotation    Hip external rotation    Knee flexion 5-   Knee extension 4+   Ankle dorsiflexion 5-   Ankle plantarflexion     Ankle inversion    Ankle eversion     (Blank rows = not tested)  LOWER EXTREMITY SPECIAL TESTS:  Bounce neg  FUNCTIONAL TESTS:  5 times sit to stand: 13 sec with some UE use  GAIT: Distance walked: short community  Assistive device utilized: None Level of assistance: Complete Independence Comments: ---                                                                                                                                TREATMENT DATE:  04/22/24 Nustep level 5  6 min SLR x3 #1   TB hip abduction 3x10 G Bridges 3x10  Squats 3x10 Heel raises 3x10 Hamstring strap stretch 3x25 sec   04/01/24 initial evaluation of right knee completed followed by instruction and trial set of HEP.   PATIENT EDUCATION:  Education details: HEP Person educated: Patient Education method: Solicitor, Actor cues, and Verbal cues Education comprehension: verbalized understanding, returned demonstration, and verbal cues required  HOME EXERCISE PROGRAM: Access Code: [email or web address] URL: [email or web address] Date: 04/22/2024 Prepared by: Burnard Meth  Exercises - Supine Quad Set  - 2 x daily - 7 x weekly - 2 sets - 10 reps - Supine Active Straight Leg Raise  - 2 x daily - 7 x weekly - 2 sets - 10 reps - Sidelying Hip Abduction  - 2 x daily - 7 x weekly - 2 sets - 10 reps - Supine Bridge  - 2 x daily - 7 x weekly - 2 sets - 10 reps - Prone Hip Extension  - 2 x daily - 7 x weekly - 2 sets - 10 reps - Supine Short Arc Quad  - 2 x daily - 7 x weekly - 2 sets - 10 reps - 3 hold - Mini Squat with Counter Support  - 2 x daily - 7 x weekly - 2 sets - 10 reps - Hooklying Clamshell with Resistance  - 2 x daily - 7 x weekly - 2 sets - 10 reps - Supine Hip Adduction Isometric with Ball  - 2 x daily - 7 x weekly - 2 sets - 10 reps - 10 hold - Standing Heel Raise  - 2 x daily - 7 x weekly - 2 sets - 10 reps - Supine Hamstring Stretch with Strap  - 2  x daily - 7 x weekly - 1  sets - 3 reps - 25 hold    ASSESSMENT:  CLINICAL IMPRESSION:Pt as been working daily on HEP and reports good pain reduction.  She has completed all STG/LTG set forth.  She has a very high co pay and the plan was to keep visits to a minimal then discharge.  HEP was reviewed and additions made.  OBJECTIVE IMPAIRMENTS: decreased endurance, decreased strength, and pain.   ACTIVITY LIMITATIONS: standing, squatting, and sleeping  PARTICIPATION LIMITATIONS: occupation  PERSONAL FACTORS: N/A are also affecting patient's functional outcome.   REHAB POTENTIAL: Good  CLINICAL DECISION MAKING: Stable/uncomplicated  EVALUATION COMPLEXITY: Low   GOALS: Goals reviewed with patient? Yes  SHORT TERM /LONG TERM GOALS: Target date: 05/13/2024   Weeks   Patient to be independent with HEP. Baseline: Goal status: MET 2.  Decrease max pain to 2 out of 10 or less. Baseline:  Goal status: MET  3. Increase strength by half a grade to 1 full grade. Baseline:  Goal status: MET  5.  Increase score of PSFS by 2-3 points. Baseline:  Goal status:MET  PLAN:  PT FREQUENCY: 1x/week  PT DURATION: 6 weeks  PLANNED INTERVENTIONS: 97164- PT Re-evaluation, 97110-Therapeutic exercises, 97530- Therapeutic activity, [id number]- Neuromuscular re-education, 97535- Self Care, [id number]- Manual therapy, Patient/Family education, and Balance training  PLAN FOR NEXT SESSION:  Discharge to HEP.  All goals completed.  PHYSICAL THERAPY DISCHARGE SUMMARY  Visits from Start of Care: 2  Current functional level related to goals / functional outcomes: Goals completed.   Remaining deficits: Strength   Education / Equipment: HEP   Patient agrees to discharge. Patient goals were met. Patient is being discharged due to meeting the stated rehab goals.     Burnard CHRISTELLA Meth, PT 04/22/2024, 3:07 PM

## 2024-04-22 ENCOUNTER — Ambulatory Visit

## 2024-04-22 DIAGNOSIS — M25561 Pain in right knee: Secondary | ICD-10-CM

## 2024-04-22 DIAGNOSIS — M25661 Stiffness of right knee, not elsewhere classified: Secondary | ICD-10-CM | POA: Diagnosis not present
